# Patient Record
Sex: Female | Born: 1995 | Race: White | Hispanic: No | Marital: Married | Smoking: Never smoker
Health system: Southern US, Community
[De-identification: ages and names within clinical notes are randomized; demographics above are authoritative.]

## PROBLEM LIST (undated history)

## (undated) ENCOUNTER — Inpatient Hospital Stay: Payer: Self-pay

## (undated) DIAGNOSIS — R630 Anorexia: Secondary | ICD-10-CM

## (undated) DIAGNOSIS — G43909 Migraine, unspecified, not intractable, without status migrainosus: Secondary | ICD-10-CM

## (undated) DIAGNOSIS — F419 Anxiety disorder, unspecified: Secondary | ICD-10-CM

## (undated) DIAGNOSIS — D649 Anemia, unspecified: Secondary | ICD-10-CM

---

## 2016-03-26 DIAGNOSIS — F419 Anxiety disorder, unspecified: Secondary | ICD-10-CM | POA: Insufficient documentation

## 2016-06-30 DIAGNOSIS — Z789 Other specified health status: Secondary | ICD-10-CM | POA: Insufficient documentation

## 2017-12-02 DIAGNOSIS — F411 Generalized anxiety disorder: Secondary | ICD-10-CM | POA: Insufficient documentation

## 2021-06-03 ENCOUNTER — Other Ambulatory Visit: Payer: Self-pay | Admitting: Obstetrics and Gynecology

## 2021-06-03 DIAGNOSIS — Z3149 Encounter for other procreative investigation and testing: Secondary | ICD-10-CM

## 2021-06-05 ENCOUNTER — Encounter: Payer: Self-pay | Admitting: Obstetrics and Gynecology

## 2021-06-05 ENCOUNTER — Other Ambulatory Visit: Payer: Self-pay

## 2021-06-05 ENCOUNTER — Ambulatory Visit
Admission: RE | Admit: 2021-06-05 | Discharge: 2021-06-05 | Disposition: A | Discharge status: Home / Self Care | Payer: 59 | Source: Ambulatory Visit | Attending: Obstetrics and Gynecology | Admitting: Obstetrics and Gynecology

## 2021-06-05 DIAGNOSIS — Z3149 Encounter for other procreative investigation and testing: Secondary | ICD-10-CM | POA: Insufficient documentation

## 2021-06-05 DIAGNOSIS — N979 Female infertility, unspecified: Secondary | ICD-10-CM | POA: Diagnosis not present

## 2021-06-05 MED ORDER — IOHEXOL 300 MG/ML  SOLN
10.0000 mL | Freq: Once | INTRAMUSCULAR | Status: AC | PRN
  Administered 2021-06-05 (×2): 10 mL

## 2021-06-05 NOTE — Progress Notes (Unsigned)
[id number]  1996-02-19 24 y.o. 06/05/21  Tracy Douglas, MD  Hysterosalpingogram Procedure Note  Date of procedure: 06/05/2021   Pre-operative Diagnosis: Infertility  Post-operative Diagnosis: same  Procedure: Hysterosalpingogram  Surgeon: Cline Cools, MD  Assistant(s):  Radiology assistant. The radiologist present for today read the imaging and agreed with findings below.  Anesthesia: None  Estimated Blood Loss:  None         Complications:  Pt did have significant emotional reaction to procedure, based in the type of procedure, her duration of infertility and desire for conception, and a long history of dysmenorrhea. We terminated the procedure once, and she was willing to try again after discussing options of Chromotubation and referral to REI. She tolerated it exceptionally well the second time.         Disposition: To home         Condition: stable  Findings: Bilateral fill and spill of the tubes and a normal endometrial contour was noted.  Procedure Details  HSG procedure discussed with the patient.  Risks, complications, alternatives have been reviewed with her and she agrees to proceed.   The patient presented to the radiology lab and was identified as the correct patient and the procedure verified as an HSG. A verbal Time Out was held with all team members present and in agreement.  Speculum was inserted in to the vagina and the cervix visualized.  The cervix was cleaned with betadine solution. The HSG catheter was inserted and the balloon insufflated with approximately 1.5 ml of air.  Patient was then repositioned for fluoroscopy.  However, she did feel presyncopal at this point and we terminated the procedure. After a brief recovery period, she requested to proceed, and though a dx lap for inspection of endometriosis is reasonable given her hx of dysmenorrhea, she would really prefer to avoid general anesthesia.   Therefore, we proceeded to replace the  speculum, clean the cervix again, and without a tenaculum the HSG catheter was again inserted. A total of 15 ml of contrast was used for the procedure. The patient tolerated the procedure well, no complications.   Bilateral fill and spill of the tubes and a normal endometrial contour was noted.  Results were reviewed with the patient at the time of the procedure. She verbalized understanding.   Tracy Douglas, MD 06/05/2021

## 2021-08-04 DIAGNOSIS — Z3483 Encounter for supervision of other normal pregnancy, third trimester: Secondary | ICD-10-CM | POA: Insufficient documentation

## 2021-08-12 LAB — OB RESULTS CONSOLE HEPATITIS B SURFACE ANTIGEN: Hepatitis B Surface Ag: NEGATIVE

## 2021-08-12 LAB — OB RESULTS CONSOLE VARICELLA ZOSTER ANTIBODY, IGG: Varicella: NON-IMMUNE/NOT IMMUNE

## 2021-08-12 LAB — OB RESULTS CONSOLE RUBELLA ANTIBODY, IGM: Rubella: IMMUNE

## 2021-09-30 ENCOUNTER — Emergency Department
Admission: EM | Admit: 2021-09-30 | Discharge: 2021-09-30 | Disposition: A | Discharge status: Home / Self Care | Payer: 59 | Attending: Emergency Medicine | Admitting: Emergency Medicine

## 2021-09-30 ENCOUNTER — Other Ambulatory Visit: Payer: Self-pay

## 2021-09-30 DIAGNOSIS — Z3A17 17 weeks gestation of pregnancy: Secondary | ICD-10-CM | POA: Insufficient documentation

## 2021-09-30 DIAGNOSIS — O3462 Maternal care for abnormality of vagina, second trimester: Secondary | ICD-10-CM | POA: Diagnosis present

## 2021-09-30 DIAGNOSIS — R879 Unspecified abnormal finding in specimens from female genital organs: Secondary | ICD-10-CM

## 2021-09-30 DIAGNOSIS — Z8616 Personal history of COVID-19: Secondary | ICD-10-CM | POA: Insufficient documentation

## 2021-09-30 LAB — WET PREP, GENITAL
Clue Cells Wet Prep HPF POC: NONE SEEN
Sperm: NONE SEEN
Trich, Wet Prep: NONE SEEN
WBC, Wet Prep HPF POC: <10 (ref <10)
Yeast Wet Prep HPF POC: NONE SEEN

## 2021-09-30 LAB — RUPTURE OF MEMBRANE (ROM)PLUS: Rom Plus: NEGATIVE

## 2021-09-30 NOTE — ED Provider Notes (Signed)
[facility] Emergency Department Provider Note  ____________________________________________   Event Date/Time   First MD Initiated Contact with Patient 09/30/21 1442     (approximate)  I have reviewed the triage vital signs and the nursing notes.   HISTORY  Chief Complaint No chief complaint on file.   HPI Tracy Morse is a 25 y.o. female with a past medical history of anxiety and COVID in the first trimester of pregnancy who presents G1 P0 at 17 weeks 4 days for assessment of some leakage of clear fluid from her vagina earlier today.  Patient states that she went to a routine appointment and did not have any issues and and had no pelvic exam done but then she when she got home she had a rush of clear fluid.  She endorses some chronic intermittent lower pain in the abdomen but this is not any different or new today.  She denies any burning with urination, bleeding, diarrhea, constipation, headache, earache, sore throat, fevers, chills, cough or other acute associated symptoms.  No injuries or falls.  Denies EtOH use, drug use or tobacco abuse.  No other acute concerns at this time.         No past medical history on file.  There are no problems to display for this patient.     Prior to Admission medications   Not on File    Allergies Cephalosporins  No family history on file.  Social History    Review of Systems  Review of Systems  Constitutional:  Negative for chills and fever.  HENT:  Negative for sore throat.   Eyes:  Negative for pain.  Respiratory:  Negative for cough and stridor.   Cardiovascular:  Negative for chest pain.  Gastrointestinal:  Positive for abdominal pain (intermittent mild over past several weeks). Negative for vomiting.  Genitourinary:  Negative for dysuria.  Musculoskeletal:  Negative for myalgias.  Skin:  Negative for rash.  Neurological:  Negative for seizures, loss of consciousness and headaches.   Psychiatric/Behavioral:  Negative for suicidal ideas.   All other systems reviewed and are negative.    ____________________________________________   PHYSICAL EXAM:  VITAL SIGNS: ED Triage Vitals  Enc Vitals Group     BP 09/30/21 1338 140/76     Pulse Rate 09/30/21 1338 (!) 101     Resp 09/30/21 1338 18     Temp 09/30/21 1343 98.5 F (36.9 C)     Temp Source 09/30/21 1343 Oral     SpO2 09/30/21 1338 99 %     Weight 09/30/21 1342 165 lb (74.8 kg)     Height 09/30/21 1342 5\' 2"  (1.575 m)     Head Circumference --      Peak Flow --      Pain Score 09/30/21 1342 2     Pain Loc --      Pain Edu? --      Excl. in GC? --    Vitals:   09/30/21 1338 09/30/21 1343  BP: 140/76   Pulse: (!) 101   Resp: 18   Temp:  98.5 F (36.9 C)  SpO2: 99%    Physical Exam Vitals and nursing note reviewed.  Constitutional:      General: She is not in acute distress.    Appearance: She is well-developed.  HENT:     Head: Normocephalic and atraumatic.     Right Ear: External ear normal.     Left Ear: External ear normal.  Nose: Nose normal.  Eyes:     Conjunctiva/sclera: Conjunctivae normal.  Cardiovascular:     Rate and Rhythm: Normal rate and regular rhythm.     Heart sounds: No murmur heard. Pulmonary:     Effort: Pulmonary effort is normal. No respiratory distress.     Breath sounds: Normal breath sounds.  Abdominal:     Palpations: Abdomen is soft.     Tenderness: There is no abdominal tenderness.  Musculoskeletal:        General: No swelling.     Cervical back: Neck supple.  Skin:    General: Skin is warm and dry.     Capillary Refill: Capillary refill takes less than 2 seconds.  Neurological:     Mental Status: She is alert and oriented to person, place, and time.  Psychiatric:        Mood and Affect: Mood normal.    Gravid abdomen. ____________________________________________   LABS (all labs ordered are listed, but only abnormal results are  displayed)  Labs Reviewed  WET PREP, GENITAL  ROM PLUS ([facility] ONLY)   ____________________________________________  EKG   ____________________________________________  RADIOLOGY  ED MD interpretation:    Official radiology report(s): No results found.  ____________________________________________   PROCEDURES  Procedure(s) performed (including Critical Care):  Procedures   ____________________________________________   INITIAL IMPRESSION / ASSESSMENT AND PLAN / ED COURSE        Patient presents with above-stated history exam for assessment of vaginal fluid noted earlier today prior to arrival.  On arrival patient is afebrile hemodynamically stable.  She denies any acute associated pain other than endorsing some chronic soreness in the lower abdomen which is not different today.  Differential includes premature rupture of membranes, urinary incontinence versus possible infectious process.  CNM   Donato Schultz  Consulted came and saw the patient and confirmed on pelvic exam that there was no protruding sac or active fluid leakage, bleeding, or other acute abnormality.  Plan is to follow-up with prep and ROM Plus.    Wet prep and ROM plus were negative.  Discussed with Donato Schultz recommends no additional testing at this time and patient can follow-up with OB.  Nothing this is reasonable.  Unclear if this was an episode of some incontinence versus other discharge given absence of infection onexam or wet prep and negative ROM plus. I think she is stable for discharge with outpatient follow-up.  Discharged in stable condition.  Strict return precautions advised and discussed.      ____________________________________________   FINAL CLINICAL IMPRESSION(S) / ED DIAGNOSES  Final diagnoses:  Abnormal vaginal fluids    Medications - No data to display   ED Discharge Orders     None        Note:  This document was prepared using Dragon voice recognition  software and may include unintentional dictation errors.    Gilles Chiquito, MD 09/30/21 [phone]

## 2021-09-30 NOTE — ED Notes (Signed)
Consulted by Antoine Primas, MD in the ER.   Tracy Morse is a 25 y.o. female. She is at [redacted]w[redacted]d gestation. No LMP recorded. Estimated Date of Delivery: 03/05/2022.  Prenatal care site: [facility]   Current pregnancy complicated by:  1. COVID during pregnancy (1st trimester) 2. Pregnancy achieved with Clomid 3. H/o mental health diagnoses  Dx: Anorexia, Anxiety 4. Elevated BP at Amenorrhea visit 5. Remote hx Cold sores 6. Varicella Non-Immune  7. Tachycardia  Chief complaint: She had a prenatal visit this morning and reports everything was fine. She came home and was standing in her kitchen when she experienced a "gush" of clear vaginal discharge. She states it did not feel like she urinated on herself. She denies any further discharge. Reports occasional cramping.  S: Resting comfortably. no CTX, no VB.no LOF,  Active fetal movement.  Denies: HA, visual changes, SOB, or RUQ/epigastric pain  Maternal Medical History:  No past medical history on file.   Allergies  Allergen Reactions   Cephalosporins Rash    Prior to Admission medications   Not on File    Family History: family history is not on file.  no history of gyn cancers  Review of Systems: A full review of systems was performed and negative except as noted in the HPI.     O:  BP (!) 142/77    Pulse 100    Temp 98.5 F (36.9 C) (Oral)    Resp 18    Ht 5\' 2"  (1.575 m)    Wt 74.8 kg    SpO2 100%    BMI 30.18 kg/m  Results for orders placed or performed during the hospital encounter of 09/30/21 (from the past 48 hour(s))  Wet prep, genital   Collection Time: 09/30/21  4:03 PM  Result Value Ref Range   Yeast Wet Prep HPF POC NONE SEEN NONE SEEN   Trich, Wet Prep NONE SEEN NONE SEEN   Clue Cells Wet Prep HPF POC NONE SEEN NONE SEEN   WBC, Wet Prep HPF POC <10 <10   Sperm NONE SEEN   ROM Plus ([facility] only)   Collection Time: 09/30/21  4:03 PM  Result Value Ref Range   Rom Plus NEGATIVE        Constitutional: NAD, AAOx3  HE/ENT: extraocular movements grossly intact, moist mucous membranes CV: RRR PULM: nl respiratory effort, CTABL     Abd: gravid, non-tender, non-distended, soft      Ext: Non-tender, Nonedematous   Psych: mood appropriate, speech normal Pelvic: SSE done, cervix is closed and thick, no vaginal discharge noted. Negative pooling.  Pelvic exam: normal external genitalia, vulva, vagina, cervix, uterus and adnexa.  Bedside 10/02/21: Approximately 17-wk fetus with active fetal movement, adequate amniotic fluid, and adequate cardiac motion. Both parents visualized the [place] with amniotic fluid, fetal movement, and cardiac motion.  A/P: 25 y.o. @ [redacted]w[redacted]d here for antenatal surveillance for vaginal discharge  Principle Diagnosis:  Normal pregnancy in 2nd trimester  Consulted by Dr. [redacted]w[redacted]d in the ER for [redacted]w[redacted]d pregnancy with ? Leaking fluid. No signs of amniotic fluid, no discharge noted in vagina. Cervix is closed. Adequate amniotic fluid noted around fetus with good fetal movement noted on bedside [redacted]w[redacted]d.  No signs of PPROM. No signs of infection. Wet prep and ROM+ both negative. Discussed with Wisdom and her husband that increased vaginal discharge in pregnancy can sometimes feel like leaking amniotic fluid. If she is ever concerned that she may have PPROM, she should come  to the ER or L&D for evaluation. Reviewed s/s of PPROM vs normal vaginal discharge. No obstetrical concerns at this time. ER doctor may discharge her if he feels that is appropriate. Follow up in OB office as indicated.   Janyce Llanos, CNM 09/30/2021 4:52 PM

## 2021-09-30 NOTE — ED Triage Notes (Signed)
Pt here with leaking fluid and wants to be evaluated. Pt states that it was a gush of fluid and was advised to come to the ED. Pt in NAD in triage.

## 2021-09-30 NOTE — ED Notes (Signed)
KC to send on call OB provider to evaluate pt.

## 2021-09-30 NOTE — ED Triage Notes (Signed)
Pt states that she is 17.[redacted] weeks pregnant and is having fluid leaking called her OBGYN and they told her to come here to be evaluated.

## 2021-10-09 DIAGNOSIS — R002 Palpitations: Secondary | ICD-10-CM | POA: Insufficient documentation

## 2021-10-19 NOTE — L&D Delivery Note (Addendum)
Delivery Note ? ?First Stage: ?Labor onset: 02/27/2022 @ 0700 ?Augmentation : cytotec, Foley balloon, pitocin, AROM ?Analgesia /Anesthesia intrapartum: epidural ?AROM at 02/27/2022 @ 0709 ? ?Second Stage: ?Complete dilation at 1429 ?Onset of pushing at 1615 ?FHR second stage Category II, variable decelerations with good recovery, intermittent late decelerations, recurrent early decelerations ? ?Delivery of a viable female infant 02/27/2022 at 1909 by Donato Schultz, CNM ?delivery of fetal head in OA position with restitution to LOA. Anterior then posterior shoulders delivered easily with gentle downward traction and fetal rotation from LOA to ROA. Continued rotating baby to OP and LOP to deliver fetal hips. ?No nuchal cord;  Baby placed on mom's chest, and attended to by peds.  ?Cord double clamped at 1 minute of life, cut by FOB. ? ?Third Stage: ?Placenta delivered Tomasa Blase intact with 3 VC @ 1913 ?Placenta disposition: discarded ?Uterine tone firm / bleeding scant ? ?1st degree vaginal laceration identified  ?Anesthesia for repair: n/a ?Repair: approximated, no bleeding ?Est. Blood Loss (mL): ? ?Complications: none ? ?Mom to postpartum.  Baby to Couplet care / Skin to Skin. ? ?Newborn: ?Birth Weight: TBD, infant skin-to-skin  ?Apgar Scores: 6, 8 ?Feeding planned: breast ? ? ? ?

## 2021-11-27 LAB — OB RESULTS CONSOLE GC/CHLAMYDIA
Chlamydia: NEGATIVE
Gonorrhea: NEGATIVE

## 2021-12-12 DIAGNOSIS — O99013 Anemia complicating pregnancy, third trimester: Secondary | ICD-10-CM | POA: Insufficient documentation

## 2021-12-25 ENCOUNTER — Encounter: Payer: Self-pay | Admitting: Obstetrics and Gynecology

## 2021-12-25 ENCOUNTER — Observation Stay: Payer: 59

## 2021-12-25 ENCOUNTER — Other Ambulatory Visit: Payer: Self-pay

## 2021-12-25 ENCOUNTER — Observation Stay
Admission: EM | Admit: 2021-12-25 | Discharge: 2021-12-25 | Disposition: A | Discharge status: Home / Self Care | Payer: 59 | Attending: Obstetrics and Gynecology | Admitting: Obstetrics and Gynecology

## 2021-12-25 DIAGNOSIS — O26842 Uterine size-date discrepancy, second trimester: Secondary | ICD-10-CM | POA: Insufficient documentation

## 2021-12-25 DIAGNOSIS — Z3A31 31 weeks gestation of pregnancy: Secondary | ICD-10-CM | POA: Insufficient documentation

## 2021-12-25 DIAGNOSIS — Y92410 Unspecified street and highway as the place of occurrence of the external cause: Secondary | ICD-10-CM | POA: Insufficient documentation

## 2021-12-25 DIAGNOSIS — N9489 Other specified conditions associated with female genital organs and menstrual cycle: Secondary | ICD-10-CM

## 2021-12-25 DIAGNOSIS — O9A213 Injury, poisoning and certain other consequences of external causes complicating pregnancy, third trimester: Principal | ICD-10-CM | POA: Insufficient documentation

## 2021-12-25 DIAGNOSIS — O368130 Decreased fetal movements, third trimester, not applicable or unspecified: Secondary | ICD-10-CM | POA: Insufficient documentation

## 2021-12-25 DIAGNOSIS — Z8616 Personal history of COVID-19: Secondary | ICD-10-CM | POA: Insufficient documentation

## 2021-12-25 DIAGNOSIS — Y9389 Activity, other specified: Secondary | ICD-10-CM | POA: Insufficient documentation

## 2021-12-25 HISTORY — DX: Migraine, unspecified, not intractable, without status migrainosus: G43.909

## 2021-12-25 HISTORY — DX: Anxiety disorder, unspecified: F41.9

## 2021-12-25 HISTORY — DX: Anorexia: R63.0

## 2021-12-25 LAB — CBC
HCT: 30.6 % — ABNORMAL LOW (ref 36.0–46.0)
Hemoglobin: 9.8 g/dL — ABNORMAL LOW (ref 12.0–15.0)
MCH: 27.1 pg (ref 26.0–34.0)
MCHC: 32 g/dL (ref 30.0–36.0)
MCV: 84.8 fL (ref 80.0–100.0)
Platelets: 211 10*3/uL (ref 150–400)
RBC: 3.61 MIL/uL — ABNORMAL LOW (ref 3.87–5.11)
RDW: 14.7 % (ref 11.5–15.5)
WBC: 10.1 10*3/uL (ref 4.0–10.5)
nRBC: 0 % (ref 0.0–0.2)

## 2021-12-25 LAB — KLEIHAUER-BETKE STAIN
Fetal Cells %: 0 %
Quantitation Fetal Hemoglobin: 0 mL

## 2021-12-25 LAB — ABO/RH: ABO/RH(D): A POS

## 2021-12-25 MED ORDER — PRENATAL MULTIVITAMIN CH: 1.0000 | ORAL_TABLET | Freq: Every day | ORAL | Status: DC

## 2021-12-25 MED ORDER — ACETAMINOPHEN 500 MG PO TABS: 1000.0000 mg | ORAL_TABLET | Freq: Four times a day (QID) | ORAL | Status: DC | PRN

## 2021-12-25 NOTE — Progress Notes (Signed)
Pt discharged home per [facility], CNM order.  Pt stable and ambulatory and an After Visit Summary was printed and given to the patient. Discharge education completed with patient/family including follow up instructions, appointments, and medication list. Pt received labor and bleeding precautions. Patient able to verbalize understanding, all questions fully answered upon discharge. Patient instructed to return to ED, call 911, or call MD for any changes in condition. Pt discharged home via personal vehicle with support person.  ? ?
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
## 2022-01-12 ENCOUNTER — Other Ambulatory Visit: Payer: Self-pay | Admitting: Obstetrics

## 2022-01-12 DIAGNOSIS — O99013 Anemia complicating pregnancy, third trimester: Secondary | ICD-10-CM

## 2022-01-14 ENCOUNTER — Ambulatory Visit
Admission: RE | Admit: 2022-01-14 | Discharge: 2022-01-14 | Disposition: A | Discharge status: Home / Self Care | Payer: 59 | Source: Ambulatory Visit | Attending: Obstetrics | Admitting: Obstetrics

## 2022-01-14 DIAGNOSIS — D649 Anemia, unspecified: Secondary | ICD-10-CM | POA: Diagnosis not present

## 2022-01-14 DIAGNOSIS — O99013 Anemia complicating pregnancy, third trimester: Secondary | ICD-10-CM | POA: Diagnosis present

## 2022-01-14 DIAGNOSIS — Z3A00 Weeks of gestation of pregnancy not specified: Secondary | ICD-10-CM | POA: Diagnosis not present

## 2022-01-14 MED ORDER — SODIUM CHLORIDE 0.9 % IV SOLN
300.0000 mg | INTRAVENOUS | Status: DC
  Administered 2022-01-14: 300 mg via INTRAVENOUS
  Filled 2022-01-14: qty 300

## 2022-01-23 ENCOUNTER — Ambulatory Visit
Admission: RE | Admit: 2022-01-23 | Discharge: 2022-01-23 | Disposition: A | Discharge status: Home / Self Care | Payer: 59 | Source: Ambulatory Visit | Attending: Obstetrics | Admitting: Obstetrics

## 2022-01-23 DIAGNOSIS — O99013 Anemia complicating pregnancy, third trimester: Secondary | ICD-10-CM | POA: Diagnosis present

## 2022-01-23 MED ORDER — SODIUM CHLORIDE 0.9 % IV SOLN
300.0000 mg | Freq: Once | INTRAVENOUS | Status: AC
  Administered 2022-01-23: 300 mg via INTRAVENOUS
  Filled 2022-01-23: qty 300

## 2022-01-30 ENCOUNTER — Ambulatory Visit
Admission: RE | Admit: 2022-01-30 | Discharge: 2022-01-30 | Disposition: A | Discharge status: Home / Self Care | Payer: 59 | Source: Ambulatory Visit | Attending: Obstetrics | Admitting: Obstetrics

## 2022-01-30 DIAGNOSIS — D649 Anemia, unspecified: Secondary | ICD-10-CM | POA: Diagnosis not present

## 2022-01-30 DIAGNOSIS — Z3A00 Weeks of gestation of pregnancy not specified: Secondary | ICD-10-CM | POA: Insufficient documentation

## 2022-01-30 DIAGNOSIS — O99013 Anemia complicating pregnancy, third trimester: Secondary | ICD-10-CM | POA: Diagnosis not present

## 2022-01-30 MED ORDER — SODIUM CHLORIDE 0.9 % IV SOLN
300.0000 mg | Freq: Once | INTRAVENOUS | Status: AC
  Administered 2022-01-30: 300 mg via INTRAVENOUS
  Filled 2022-01-30: qty 15

## 2022-02-02 ENCOUNTER — Ambulatory Visit: Payer: 59

## 2022-02-05 LAB — OB RESULTS CONSOLE HIV ANTIBODY (ROUTINE TESTING): HIV: NONREACTIVE

## 2022-02-06 ENCOUNTER — Ambulatory Visit
Admission: RE | Admit: 2022-02-06 | Discharge: 2022-02-06 | Disposition: A | Discharge status: Home / Self Care | Payer: 59 | Source: Ambulatory Visit | Attending: Obstetrics | Admitting: Obstetrics

## 2022-02-06 DIAGNOSIS — Z3A00 Weeks of gestation of pregnancy not specified: Secondary | ICD-10-CM | POA: Diagnosis not present

## 2022-02-06 DIAGNOSIS — O99013 Anemia complicating pregnancy, third trimester: Secondary | ICD-10-CM | POA: Diagnosis not present

## 2022-02-06 MED ORDER — SODIUM CHLORIDE FLUSH 0.9 % IV SOLN
INTRAVENOUS | Status: AC
  Administered 2022-02-06: 10 mL
  Filled 2022-02-06: qty 10

## 2022-02-06 MED ORDER — SODIUM CHLORIDE 0.9 % IV SOLN
300.0000 mg | Freq: Once | INTRAVENOUS | Status: AC
  Administered 2022-02-06: 300 mg via INTRAVENOUS
  Filled 2022-02-06: qty 300

## 2022-02-12 DIAGNOSIS — B951 Streptococcus, group B, as the cause of diseases classified elsewhere: Secondary | ICD-10-CM | POA: Insufficient documentation

## 2022-02-12 DIAGNOSIS — Z8616 Personal history of COVID-19: Secondary | ICD-10-CM | POA: Insufficient documentation

## 2022-02-17 ENCOUNTER — Other Ambulatory Visit: Payer: Self-pay | Admitting: Obstetrics and Gynecology

## 2022-02-17 NOTE — Progress Notes (Signed)
26 y.o. G1P0000 at 107w5d by Patient's last menstrual period was 05/29/2021 (exact date). consistent with ultrasound @[redacted]w[redacted]d  .Estimated Date of Delivery: 03/05/2022. ?Sex of baby and name: boy " "                         FOB:    Chase ?  ?Factors complicating this pregnancy  ?  ?1. COVID during pregnancy ?Which trimester? 1st trimester ?3rd trimester growth u/s scheduled: 01/21/22 ?Weekly NST at 36 weeks ?  ?2. Pregnancy achieved with Clomid ?Taking progesterone until 12-16 weeks ?  ?3. H/o mental health diagnoses ?Dx: Anorexia, Anxiety ?Medications prior to pregnancy: none ?Medical team following her: ?Medications during pregnancy: ?Counseling:  ?4. Elevated BP at Amenorrhea visit ?06/09/21 122/84 ?07/21/21 143/81 - Amenorrhea visit ?07/23/21 112/74 ?08/12/21 146/83 recheck 116/76 - NOB visit ?  ?5. Would like to refuse STI testing in pregnancy  ?Discussed at 14wks, pt with neg HIV at her annual physical.  ?She agrees to HIV and GC/Ct for screening at 36wks ?6. Remote hx Cold sores ?No genital sores ever, last oral HSV 4-40yrs ago ?Would like to be on suppressive tx prior to delivery ?Valtrex 500mg  BID at 36wks  ?7. Varicella Non-Immune  ?Advise vaccine post partum ?  ?8. Tachycardia ?HR 140-150 when walking ?Referral to cardiology placed 09/30/21 ?10/30/21 no significant findings ?Better since iron transfusions  ?  ?9. Anemia in pregnancy  ?Hgb 10.1 at 28 weeks  ?Started on iron supplements   ?01/07/22: Hgb: 10.8 Ferritin: 8 ?Weekly iron infusions - received 4 transfusions  ?  ?      10. + GBS  2/23 - treat in labor  ?New allergy dicloxacillin - rash/itching ?Has tried amoxicillin in past with no reaction  ?  ?Screening results and needs: ?NOB:  ?Medicaid Questionnaire: N/A ?[]  [place] [facility] program  ?Depression Score: 1 ?MBT: A Pos  Ab screen: Neg  Pap: 03/05/20 NILM HIV: not done Hep B/RPR: Neg/NR G/C: not done ?Rubella: Immune   VZV: Non-Immune ?Aneuploidy:  ?First trimester:  ?MaternitT21: declined 08/12/21 ?Second  trimester (AFP/tetra): declined 11/22 ?28 weeks:  ?Review Medicaid Questionnaire:n/a ?Depression Score:10 ?Blood consent:signed 12/11/21 ?Hgb: 10.1  Platelets: 241    Glucola: 95  Rhogam: N/A ?36 weeks:  ?GBS: positive   G/C: neg/neg  Hgb: 11.1  Platelets: 215   HIV/RPR: neg/NR   ?Last 08/14/21:  ?07/21/21: Uterus retroverted, Single, viable IUP, S=[redacted]w[redacted]d, Yolk sac and amnion seen, FHR= 162bpm, Cervical length=3.74cm, Rt corpus luteum cyst=2.3cm, Left ovary appears wnl, Free fluid seen near both ovaries ?10/16/21:Normal anatomy seen Single, viable IUP, S=[redacted]w[redacted]d FHR=147bpm Cervical length=5.72cm B/l ovaries appear wnl Placenta=anterior Position=breech ?   12/11/21: breech Fhr=134 bpm Ant   plac Afi=17.83 cm= 68% Efw=1248 g= 62% ?                01/21/22: transverse, head rt, ant plac, [redacted]w[redacted]d g@75 %, AFI- 16.67 cm @61 % ?      02/12/22: EFW=7lb2oz(3232g)=80% AFI=21.91cm@85 % FHR=139bpm Placenta=anterior Position =vertex,spine rt ?Immunization:   ?Flu in season -  ?Tdap at 27-36 weeks - given bl 12/11/21 ?Contraception Plan: NFP, non-hormonal - needed clomid for this pregnancy  ?Feeding Plan: breast  ?Labor plans - would like IOL 39-40 weeks  ?

## 2022-02-23 ENCOUNTER — Other Ambulatory Visit: Payer: Self-pay | Admitting: Obstetrics and Gynecology

## 2022-02-26 ENCOUNTER — Encounter: Payer: Self-pay | Admitting: Obstetrics and Gynecology

## 2022-02-26 ENCOUNTER — Inpatient Hospital Stay
Admission: RE | Admit: 2022-02-26 | Discharge: 2022-03-01 | DRG: 807 | Disposition: A | Discharge status: Home / Self Care | Payer: 59 | Attending: Certified Nurse Midwife | Admitting: Certified Nurse Midwife

## 2022-02-26 ENCOUNTER — Other Ambulatory Visit: Payer: Self-pay

## 2022-02-26 DIAGNOSIS — O99344 Other mental disorders complicating childbirth: Secondary | ICD-10-CM | POA: Diagnosis present

## 2022-02-26 DIAGNOSIS — O26849 Uterine size-date discrepancy, unspecified trimester: Secondary | ICD-10-CM | POA: Insufficient documentation

## 2022-02-26 DIAGNOSIS — O9902 Anemia complicating childbirth: Secondary | ICD-10-CM | POA: Diagnosis present

## 2022-02-26 DIAGNOSIS — O26893 Other specified pregnancy related conditions, third trimester: Secondary | ICD-10-CM | POA: Diagnosis present

## 2022-02-26 DIAGNOSIS — F419 Anxiety disorder, unspecified: Secondary | ICD-10-CM | POA: Diagnosis present

## 2022-02-26 DIAGNOSIS — Z3A39 39 weeks gestation of pregnancy: Secondary | ICD-10-CM | POA: Diagnosis not present

## 2022-02-26 DIAGNOSIS — Z3490 Encounter for supervision of normal pregnancy, unspecified, unspecified trimester: Secondary | ICD-10-CM | POA: Diagnosis present

## 2022-02-26 DIAGNOSIS — O99824 Streptococcus B carrier state complicating childbirth: Secondary | ICD-10-CM | POA: Diagnosis present

## 2022-02-26 DIAGNOSIS — Z8616 Personal history of COVID-19: Secondary | ICD-10-CM

## 2022-02-26 HISTORY — DX: Anemia, unspecified: D64.9

## 2022-02-26 LAB — CBC
HCT: 37.6 % (ref 36.0–46.0)
Hemoglobin: 12.5 g/dL (ref 12.0–15.0)
MCH: 28 pg (ref 26.0–34.0)
MCHC: 33.2 g/dL (ref 30.0–36.0)
MCV: 84.3 fL (ref 80.0–100.0)
Platelets: 190 10*3/uL (ref 150–400)
RBC: 4.46 MIL/uL (ref 3.87–5.11)
RDW: 17.8 % — ABNORMAL HIGH (ref 11.5–15.5)
WBC: 10.6 10*3/uL — ABNORMAL HIGH (ref 4.0–10.5)
nRBC: 0 % (ref 0.0–0.2)

## 2022-02-26 LAB — TYPE AND SCREEN
ABO/RH(D): A POS
Antibody Screen: NEGATIVE

## 2022-02-26 MED ORDER — SOD CITRATE-CITRIC ACID 500-334 MG/5ML PO SOLN: 30.0000 mL | ORAL | Status: DC | PRN

## 2022-02-26 MED ORDER — SODIUM CHLORIDE 0.9 % IV SOLN
1.0000 g | INTRAVENOUS | Status: DC
  Administered 2022-02-27 (×2): 1 g via INTRAVENOUS
  Filled 2022-02-26 (×2): qty 1000

## 2022-02-26 MED ORDER — LIDOCAINE HCL (PF) 1 % IJ SOLN
INTRAMUSCULAR | Status: AC
  Filled 2022-02-26: qty 30

## 2022-02-26 MED ORDER — AMMONIA AROMATIC IN INHA
RESPIRATORY_TRACT | Status: AC
  Filled 2022-02-26: qty 10

## 2022-02-26 MED ORDER — MISOPROSTOL 50MCG HALF TABLET
50.0000 ug | ORAL_TABLET | ORAL | Status: DC | PRN
  Administered 2022-02-26: 50 ug via BUCCAL
  Filled 2022-02-26: qty 1

## 2022-02-26 MED ORDER — ONDANSETRON HCL 4 MG/2ML IJ SOLN
4.0000 mg | Freq: Four times a day (QID) | INTRAMUSCULAR | Status: DC | PRN
Start: 2022-02-26 — End: 2022-02-27
  Administered 2022-02-27: 4 mg via INTRAVENOUS
  Filled 2022-02-26: qty 2

## 2022-02-26 MED ORDER — TERBUTALINE SULFATE 1 MG/ML IJ SOLN: 0.2500 mg | Freq: Once | INTRAMUSCULAR | Status: DC | PRN

## 2022-02-26 MED ORDER — MISOPROSTOL 25 MCG QUARTER TABLET
25.0000 ug | ORAL_TABLET | ORAL | Status: DC | PRN
  Filled 2022-02-26 (×2): qty 1

## 2022-02-26 MED ORDER — OXYTOCIN 10 UNIT/ML IJ SOLN
INTRAMUSCULAR | Status: AC
  Filled 2022-02-26: qty 2

## 2022-02-26 MED ORDER — LIDOCAINE HCL (PF) 1 % IJ SOLN
30.0000 mL | INTRAMUSCULAR | Status: DC | PRN
Start: 2022-02-26 — End: 2022-02-27

## 2022-02-26 MED ORDER — MISOPROSTOL 25 MCG QUARTER TABLET
25.0000 ug | ORAL_TABLET | ORAL | Status: DC | PRN
  Administered 2022-02-26: 25 ug via ORAL

## 2022-02-26 MED ORDER — OXYTOCIN-SODIUM CHLORIDE 30-0.9 UT/500ML-% IV SOLN
2.5000 [IU]/h | INTRAVENOUS | Status: DC
  Filled 2022-02-26: qty 1000

## 2022-02-26 MED ORDER — LACTATED RINGERS IV SOLN: 500.0000 mL | INTRAVENOUS | Status: DC | PRN

## 2022-02-26 MED ORDER — HYDROXYZINE HCL 50 MG PO TABS
50.0000 mg | ORAL_TABLET | Freq: Every evening | ORAL | Status: DC | PRN
  Administered 2022-02-26: 50 mg via ORAL
  Filled 2022-02-26 (×2): qty 1

## 2022-02-26 MED ORDER — MISOPROSTOL 200 MCG PO TABS
ORAL_TABLET | ORAL | Status: AC
  Administered 2022-02-26: 25 ug via VAGINAL
  Filled 2022-02-26: qty 4

## 2022-02-26 MED ORDER — LACTATED RINGERS IV SOLN: INTRAVENOUS | Status: DC

## 2022-02-26 MED ORDER — FENTANYL CITRATE (PF) 100 MCG/2ML IJ SOLN
INTRAMUSCULAR | Status: AC
  Filled 2022-02-26: qty 2

## 2022-02-26 MED ORDER — FENTANYL CITRATE (PF) 100 MCG/2ML IJ SOLN
50.0000 ug | Freq: Once | INTRAMUSCULAR | Status: AC
  Administered 2022-02-26: 50 ug via INTRAVENOUS

## 2022-02-26 MED ORDER — MISOPROSTOL 25 MCG QUARTER TABLET
ORAL_TABLET | ORAL | Status: AC
  Administered 2022-02-26: 25 ug via ORAL
  Filled 2022-02-26: qty 1

## 2022-02-26 MED ORDER — OXYTOCIN BOLUS FROM INFUSION
333.0000 mL | Freq: Once | INTRAVENOUS | Status: AC
  Administered 2022-02-27: 333 mL via INTRAVENOUS

## 2022-02-26 MED ORDER — OXYTOCIN 10 UNIT/ML IJ SOLN
10.0000 [IU] | Freq: Once | INTRAMUSCULAR | Status: DC
Start: 2022-02-26 — End: 2022-02-27

## 2022-02-26 MED ORDER — SODIUM CHLORIDE 0.9 % IV SOLN
2.0000 g | Freq: Once | INTRAVENOUS | Status: AC
Start: 2022-02-26 — End: 2022-02-27
  Administered 2022-02-27: 2 g via INTRAVENOUS
  Filled 2022-02-26: qty 2000

## 2022-02-26 NOTE — H&P (Signed)
OB History & Physical  ? ?History of Present Illness:  ?Chief Complaint: scheduled induction ? ?HPI:  ?Tracy Morse is a 26 y.o. G1P0 female at [redacted]w[redacted]d dated by LMP and c/w [place] at [redacted]w[redacted]d.  She presents to L&D for scheduled IOL.  ? ?Reports active FM, denies UCs, LOF or VB.  ?  ? ?Pregnancy Issues: ?1. COVID during pregnancy ?2. Pregnancy achieved with Clomid ?3. H/o mental health diagnoses; Anorexia, Anxiety ?4. Elevated BP at Amenorrhea visit ?5. Varicella NON-immune ?6. Anemia in preg ?7. Tachycardia, better after iron infusions ?8. GBS Pos, no reaction to Amoxicillin ? ? ?Maternal Medical History:  ? ?Past Medical History:  ?Diagnosis Date  ? Anemia   ? Anorexia   ? Anxiety   ? Migraine   ? ? ?History reviewed. No pertinent surgical history. ? ?Allergies  ?Allergen Reactions  ? Cephalosporins Rash  ? Dicloxacillin Sodium Itching  ? ? ?Prior to Admission medications   ?Medication Sig Start Date End Date Taking? Authorizing Provider  ?Prenatal Vit-Fe Fumarate-FA (MULTIVITAMIN-PRENATAL) 27-0.8 MG TABS tablet Take 1 tablet by mouth daily at 12 noon.   Yes [provider]  ?valACYclovir (VALTREX) 500 MG tablet Take 500 mg by mouth daily.   Yes [provider]  ?clindamycin (CLEOCIN) 300 MG capsule Take 300 mg by mouth 4 (four) times daily. ?Patient not taking: Reported on 02/06/2022    [provider]  ?ferrous sulfate 325 (65 FE) MG EC tablet Take 325 mg by mouth 3 (three) times daily with meals. ?Patient not taking: Reported on 02/06/2022    [provider]  ? ? ? ?Prenatal care site: [facility] ? ?Social History: She  reports that she has never smoked. She has never used smokeless tobacco. She reports that she does not drink alcohol and does not use drugs. ? ?Family History: family history includes Alcohol abuse in her father; Anxiety disorder in her mother; Colon cancer in her maternal grandmother and paternal grandmother; Colon polyps in her father and mother;  Coronary artery disease in her maternal grandfather and paternal grandfather; High blood pressure in her father; Hyperlipidemia in her father and mother.  ? ?Review of Systems: A full review of systems was performed and negative except as noted in the HPI.   ? ? ?Physical Exam:  ?Vital Signs: BP 130/81 (BP Location: Left Arm)   Pulse (!) 112   Temp 98.6 ?F (37 ?C) (Oral)   Resp 18   Ht 5\' 2"  (1.575 m)   Wt 88 kg   BMI 35.48 kg/m?  ?General: no acute distress.  ?HEENT: normocephalic, atraumatic ?Heart: regular rate & rhythm.  No murmurs/rubs/gallops ?Lungs: clear to auscultation bilaterally, normal respiratory effort ?Abdomen: soft, gravid, non-tender;  EFW: 7.5lbs ?Pelvic:  ? External: Normal external female genitalia ? Cervix: Dilation: 1 /   /    ?  ?Extremities: non-tender, symmetric, no edema bilaterally.  DTRs: 2+  ?Neurologic: Alert & oriented x 3.   ? ?No results Morse for this or any previous visit (from the past 24 hour(s)). ? ?Pertinent Results:  ?Prenatal Labs: ?Blood type/Rh A pos  ?Antibody screen neg  ?Rubella Immune  ?Varicella NON- Immune  ?RPR NR  ?HBsAg Neg  ?HIV NR  ?GC neg  ?Chlamydia neg  ?Genetic screening  declined  ?1 hour GTT  95  ?3 hour GTT   ?GBS  POS  ? ?FHT: 135bpm, mod variability, + accels, no decels ?TOCO: q6-52min ?SVE:  Dilation: 1 /   /    ?  ?  Cephalic by leopolds/SVE ? ?No results Morse. ? ?Assessment:  ?Tracy Morse is a 26 y.o. G1P0 female at [redacted]w[redacted]d with elective IOL.  ? ?Plan:  ?1. Admit to Labor & Delivery; consents reviewed and obtained ? ?2. Fetal Well being  ?- Fetal Tracing: cat I ?- Group B Streptococcus ppx indicated: Pos ?- Presentation: cephalic confirmed by exam and Leopolds  ? ?3. Routine OB: ?- Prenatal labs reviewed, as above ?- Rh A pos ?- CBC, T&S, RPR on admit ?- Clear fluids, IVF ? ?4. Induction of Labor ?-  Contractions: external toco in place ?-  Pelvis adequate for TOL ?-  Plan for induction with cytotec for ripening ?-  Plan for continuous  fetal monitoring  ?-  Maternal pain control as desired ?- Anticipate vaginal delivery ? ?5. Post Partum Planning: ?- Infant feeding: breast ?- Contraception: TBD ?-Tdap 12/11/21/flu due in season ? ?Tracy Morse, CNM ?02/26/22 ?8:29 AM ? ? ? ? ?

## 2022-02-26 NOTE — Progress Notes (Addendum)
Labor Progress Note ? ?Tracy Morse is a 26 y.o. G1P0 at [redacted]w[redacted]d by LMP admitted for induction of labor due to Elective at term. ? ?Subjective: fot feeling any pain, feeling frustrated.  ? ?Objective: ?BP 124/78 (BP Location: Left Arm)   Pulse 92   Temp 99 ?F (37.2 ?C) (Oral)   Resp 19   Ht 5\' 2"  (1.575 m)   Wt 88 kg   BMI 35.48 kg/m?  ?Notable VS details: reviewed ? ?Vitals:  ? 02/26/22 0805 02/26/22 1146 02/26/22 1600 02/26/22 1930  ?BP: 130/81 132/71 132/70 124/78  ? ? ? ?Fetal Assessment: ?FHT:  FHR: 125 bpm, variability: moderate,  accelerations:  Present,  decelerations:  Absent ?Category/reactivity:  Category I ?UC:   irregular, every 3-6 minutes ?SVE:   ?Dilation: 3cm  ?Effacement: 50%  ?Station:  -2  ?Consistency: soft  ?Position: posterior  ?Membrane status:intact ?Amniotic color: n/a ? ?Labs: ?Lab Results  ?Component Value Date  ? WBC 10.6 (H) 02/26/2022  ? HGB 12.5 02/26/2022  ? HCT 37.6 02/26/2022  ? MCV 84.3 02/26/2022  ? PLT 190 02/26/2022  ? ? ?Assessment / Plan: ?G1 at 39.0wks, elective term induction ? ?Labor: s/p Cytotec oral and vaginal x 1, s/p foley balloon and oral cytotec, cytotec repeat again now- will give buccal due to pt discomfort with exams.  Plan to start Pitocin at 0600 ?Preeclampsia:  no signs or symptoms of toxicity ?Fetal Wellbeing:  Category I ?Pain Control:  Labor support without medications ?I/D:   GBS Pos- will start Abx with active labor or SROM ?Anticipated MOD:  NSVD ? ?04/28/2022, CNM ?02/26/2022, 10:27 PM ? ? ? ? ? ? ? ? ?

## 2022-02-26 NOTE — Progress Notes (Signed)
Labor Progress Note ? ?Tracy Morse is a 26 y.o. G1P0 at [redacted]w[redacted]d by LMP admitted for induction of labor due to Elective at term. ? ?Subjective: feeling some UCs, mild and crampy ? ?Objective: ?BP 130/81 (BP Location: Left Arm)   Pulse (!) 112   Temp 98.6 ?F (37 ?C) (Oral)   Resp 18   Ht 5\' 2"  (1.575 m)   Wt 88 kg   BMI 35.48 kg/m?  ?Notable VS details: reviewed ? ?Vitals:  ? 02/26/22 0805  ?BP: 130/81  ? ? ? ?Fetal Assessment: ?FHT:  FHR: 135 bpm, variability: moderate,  accelerations:  Present,  decelerations:  Absent ?Category/reactivity:  Category I ?UC:   irregular, every 1-3 minutes ?SVE:    ?Dilation: 1cm  ?Effacement: 50%  ?Station:  -1  ?Consistency: soft  ?Position: posterior  ?Membrane status:intact ?Amniotic color: n/a ? ?Labs: ?Lab Results  ?Component Value Date  ? WBC 10.6 (H) 02/26/2022  ? HGB 12.5 02/26/2022  ? HCT 37.6 02/26/2022  ? MCV 84.3 02/26/2022  ? PLT 190 02/26/2022  ? ? ?Assessment / Plan: ?G1 at 39.0wks, elective term induction ? ?Labor: s/p Cytotec oral and vaginal approx 4hrs ago, pt feeling cramping and contracting well. Cervix 1/long. Will reassess 2hours, if UCs spaced out, will repeat cytotec for ripening; consider [facility] cath.  ?Preeclampsia:  no signs or symptoms of toxicity ?Fetal Wellbeing:  Category I ?Pain Control:  Labor support without medications ?I/D:   GBS Pos- will start Abx with active labor or SROM ?Anticipated MOD:  NSVD ? ?[facility], CNM ?02/26/2022, 2:37 PM ? ? ? ? ? ? ? ? ?

## 2022-02-26 NOTE — Progress Notes (Signed)
Labor Progress Note ? ?Tracy Morse is a 26 y.o. G1P0 at [redacted]w[redacted]d by LMP admitted for induction of labor due to Elective at term. ? ?Subjective: feeling more crampy with UCs. IVPM given for premedication before catheter placement.  ? ?Objective: ?BP 132/70 (BP Location: Left Arm)   Pulse 88   Temp 98.3 ?F (36.8 ?C) (Oral)   Resp 18   Ht 5\' 2"  (1.575 m)   Wt 88 kg   BMI 35.48 kg/m?  ?Notable VS details: reviewed ? ?Vitals:  ? 02/26/22 0805 02/26/22 1146 02/26/22 1600  ?BP: 130/81 132/71 132/70  ? ? ? ?Fetal Assessment: ?FHT:  FHR: 135 bpm, variability: moderate,  accelerations:  Present,  decelerations:  Absent ?Category/reactivity:  Category I ?UC:   irregular, every 1-5 minutes ?SVE:   Foley balloon placed, inflated with 40ml normal saline and taped to gentle traction.  ?Dilation: 1cm  ?Effacement: 50%  ?Station:  -1  ?Consistency: soft  ?Position: posterior  ?Membrane status:intact ?Amniotic color: n/a ? ?Labs: ?Lab Results  ?Component Value Date  ? WBC 10.6 (H) 02/26/2022  ? HGB 12.5 02/26/2022  ? HCT 37.6 02/26/2022  ? MCV 84.3 02/26/2022  ? PLT 190 02/26/2022  ? ? ?Assessment / Plan: ?G1 at 39.0wks, elective term induction ? ?Labor: s/p Cytotec oral and vaginal x 1, now has foley balloon placed and cytotec given orally.  ?Preeclampsia:  no signs or symptoms of toxicity ?Fetal Wellbeing:  Category I ?Pain Control:  Labor support without medications ?I/D:   GBS Pos- will start Abx with active labor or SROM ?Anticipated MOD:  NSVD ? ?04/28/2022, CNM ?02/26/2022, 5:22 PM ? ? ? ? ? ? ? ? ?

## 2022-02-27 ENCOUNTER — Encounter: Payer: Self-pay | Admitting: Obstetrics and Gynecology

## 2022-02-27 ENCOUNTER — Inpatient Hospital Stay: Payer: 59 | Admitting: Anesthesiology

## 2022-02-27 LAB — RPR: RPR Ser Ql: NONREACTIVE

## 2022-02-27 MED ORDER — BENZOCAINE-MENTHOL 20-0.5 % EX AERO
1.0000 "application " | INHALATION_SPRAY | CUTANEOUS | Status: DC | PRN
  Administered 2022-02-27: 1 via TOPICAL
  Filled 2022-02-27 (×2): qty 56

## 2022-02-27 MED ORDER — FENTANYL-BUPIVACAINE-NACL 0.5-0.125-0.9 MG/250ML-% EP SOLN
EPIDURAL | Status: AC
  Filled 2022-02-27: qty 250

## 2022-02-27 MED ORDER — FENTANYL CITRATE (PF) 100 MCG/2ML IJ SOLN
INTRAMUSCULAR | Status: DC | PRN
  Administered 2022-02-27: 100 ug via EPIDURAL

## 2022-02-27 MED ORDER — ACETAMINOPHEN 500 MG PO TABS
1000.0000 mg | ORAL_TABLET | Freq: Four times a day (QID) | ORAL | Status: DC | PRN
  Administered 2022-02-27: 1000 mg via ORAL

## 2022-02-27 MED ORDER — LACTATED RINGERS IV SOLN
500.0000 mL | Freq: Once | INTRAVENOUS | Status: AC
  Administered 2022-02-27: 500 mL via INTRAVENOUS

## 2022-02-27 MED ORDER — FENTANYL-BUPIVACAINE-NACL 0.5-0.125-0.9 MG/250ML-% EP SOLN
12.0000 mL/h | EPIDURAL | Status: DC | PRN
  Administered 2022-02-27: 12 mL/h via EPIDURAL

## 2022-02-27 MED ORDER — WITCH HAZEL-GLYCERIN EX PADS
1.0000 "application " | MEDICATED_PAD | CUTANEOUS | Status: DC | PRN
  Administered 2022-02-27: 1 via TOPICAL
  Filled 2022-02-27 (×2): qty 100

## 2022-02-27 MED ORDER — VARICELLA VIRUS VACCINE LIVE 1350 PFU/0.5ML IJ SUSR
0.5000 mL | Freq: Once | INTRAMUSCULAR | Status: DC
  Filled 2022-02-27: qty 0.5

## 2022-02-27 MED ORDER — PRENATAL MULTIVITAMIN CH
1.0000 | ORAL_TABLET | Freq: Every day | ORAL | Status: DC
  Administered 2022-02-28: 1 via ORAL
  Filled 2022-02-27: qty 1

## 2022-02-27 MED ORDER — BUTORPHANOL TARTRATE 1 MG/ML IJ SOLN
INTRAMUSCULAR | Status: AC
Start: 2022-02-27 — End: 2022-02-27
  Filled 2022-02-27: qty 1

## 2022-02-27 MED ORDER — ONDANSETRON HCL 4 MG PO TABS: 4.0000 mg | ORAL_TABLET | ORAL | Status: DC | PRN

## 2022-02-27 MED ORDER — DIPHENHYDRAMINE HCL 50 MG/ML IJ SOLN: 12.5000 mg | INTRAMUSCULAR | Status: DC | PRN

## 2022-02-27 MED ORDER — ACETAMINOPHEN 500 MG PO TABS
ORAL_TABLET | ORAL | Status: AC
  Administered 2022-02-27: 650 mg via ORAL
  Filled 2022-02-27: qty 2

## 2022-02-27 MED ORDER — ONDANSETRON HCL 4 MG/2ML IJ SOLN: 4.0000 mg | INTRAMUSCULAR | Status: DC | PRN

## 2022-02-27 MED ORDER — LIDOCAINE HCL (PF) 1 % IJ SOLN
INTRAMUSCULAR | Status: DC | PRN
  Administered 2022-02-27 (×3): 1 mL

## 2022-02-27 MED ORDER — ZOLPIDEM TARTRATE 5 MG PO TABS: 5.0000 mg | ORAL_TABLET | Freq: Every evening | ORAL | Status: DC | PRN

## 2022-02-27 MED ORDER — OXYCODONE HCL 5 MG PO TABS: 5.0000 mg | ORAL_TABLET | ORAL | Status: DC | PRN

## 2022-02-27 MED ORDER — PHENYLEPHRINE 80 MCG/ML (10ML) SYRINGE FOR IV PUSH (FOR BLOOD PRESSURE SUPPORT): 80.0000 ug | PREFILLED_SYRINGE | INTRAVENOUS | Status: DC | PRN

## 2022-02-27 MED ORDER — PRENATAL MULTIVITAMIN CH: 1.0000 | ORAL_TABLET | Freq: Every day | ORAL | Status: DC

## 2022-02-27 MED ORDER — FENTANYL CITRATE (PF) 100 MCG/2ML IJ SOLN
INTRAMUSCULAR | Status: AC
  Filled 2022-02-27: qty 2

## 2022-02-27 MED ORDER — DIBUCAINE (PERIANAL) 1 % EX OINT: 1.0000 "application " | TOPICAL_OINTMENT | CUTANEOUS | Status: DC | PRN

## 2022-02-27 MED ORDER — SENNOSIDES-DOCUSATE SODIUM 8.6-50 MG PO TABS
2.0000 | ORAL_TABLET | Freq: Every day | ORAL | Status: DC
  Administered 2022-02-28 – 2022-03-01 (×2): 2 via ORAL
  Filled 2022-02-27 (×2): qty 2

## 2022-02-27 MED ORDER — SODIUM CHLORIDE 0.9 % IV SOLN
INTRAVENOUS | Status: DC | PRN
  Administered 2022-02-27 (×2): 5 mL via EPIDURAL

## 2022-02-27 MED ORDER — EPHEDRINE 5 MG/ML INJ: 10.0000 mg | INTRAVENOUS | Status: DC | PRN

## 2022-02-27 MED ORDER — COCONUT OIL OIL
1.0000 "application " | TOPICAL_OIL | Status: DC | PRN
  Filled 2022-02-27: qty 120

## 2022-02-27 MED ORDER — DIPHENHYDRAMINE HCL 25 MG PO CAPS: 25.0000 mg | ORAL_CAPSULE | Freq: Four times a day (QID) | ORAL | Status: DC | PRN

## 2022-02-27 MED ORDER — OXYTOCIN-SODIUM CHLORIDE 30-0.9 UT/500ML-% IV SOLN
1.0000 m[IU]/min | INTRAVENOUS | Status: DC
  Administered 2022-02-27: 2 m[IU]/min via INTRAVENOUS

## 2022-02-27 MED ORDER — LIDOCAINE-EPINEPHRINE (PF) 1.5 %-1:200000 IJ SOLN: INTRAMUSCULAR | Status: DC | PRN

## 2022-02-27 MED ORDER — LIDOCAINE-EPINEPHRINE (PF) 1.5 %-1:200000 IJ SOLN
INTRAMUSCULAR | Status: DC | PRN
Start: 2022-02-27 — End: 2022-02-27
  Administered 2022-02-27: 3 mL via EPIDURAL

## 2022-02-27 MED ORDER — SIMETHICONE 80 MG PO CHEW: 80.0000 mg | CHEWABLE_TABLET | ORAL | Status: DC | PRN

## 2022-02-27 MED ORDER — IBUPROFEN 600 MG PO TABS
600.0000 mg | ORAL_TABLET | Freq: Four times a day (QID) | ORAL | Status: DC
  Administered 2022-02-27 – 2022-03-01 (×7): 600 mg via ORAL
  Filled 2022-02-27 (×7): qty 1

## 2022-02-27 MED ORDER — ACETAMINOPHEN 325 MG PO TABS
650.0000 mg | ORAL_TABLET | ORAL | Status: DC | PRN
Start: 2022-02-27 — End: 2022-03-01
  Administered 2022-02-28 – 2022-03-01 (×4): 650 mg via ORAL
  Filled 2022-02-27 (×5): qty 2

## 2022-02-27 NOTE — Progress Notes (Signed)
Labor Progress Note ? ?Tracy Morse is a 26 y.o. G1P0 at [redacted]w[redacted]d by LMP admitted for induction of labor due to Elective at term. ? ?Subjective: She is feeling much more comfortable with her epidural. Can feel the pressure of the contractions but denies any pain. ? ?Objective: ?BP (!) 145/69   Pulse 76   Temp 98.1 ?F (36.7 ?C) (Oral)   Resp 18   Ht 5\' 2"  (1.575 m)   Wt 88 kg   SpO2 98%   BMI 35.48 kg/m?  ?Notable VS details: reviewed ? ?Fetal Assessment: ?FHT:  FHR: 135 bpm, variability: moderate,  accelerations:  Present,  decelerations:  Absent ?Category/reactivity:  Category I ?UC:   regular, every 2-3 minutes ?SVE:   RN briefly checked patient immediately prior to epidural placement and states pt is 5cm ?Membrane status:AROM @ ?Amniotic color: clear ? ?Labs: ?Lab Results  ?Component Value Date  ? WBC 10.6 (H) 02/26/2022  ? HGB 12.5 02/26/2022  ? HCT 37.6 02/26/2022  ? MCV 84.3 02/26/2022  ? PLT 190 02/26/2022  ? ? ?Assessment / Plan: ?Induction of labor due to elective at term,  progressing well on pitocin ? ?Labor:  Progressing well on pitocin  after AROM ?Preeclampsia:  no signs or symptoms of toxicity ?Fetal Wellbeing:  Category I ?Pain Control:   just received an epidural ?I/D:   GBS positive, received first dose of PCN at 0700 ?Anticipated MOD:  NSVD ? ?04/28/2022, CNM ?02/27/2022, 10:08 AM ? ? ? ? ? ? ? ? ?

## 2022-02-27 NOTE — Progress Notes (Signed)
Labor Progress Note ? ?Tracy Morse is a 26 y.o. G1P0 at [redacted]w[redacted]d by LMP admitted for induction of labor due to Elective at term. ? ?Subjective: she is feeling better after an epidural bolus ? ?Objective: ?BP (!) 123/42   Pulse 77   Temp 98.6 ?F (37 ?C) (Oral)   Resp 18   Ht 5\' 2"  (1.575 m)   Wt 88 kg   SpO2 99%   BMI 35.48 kg/m?  ?Notable VS details: reviewed ? ?Fetal Assessment: ?FHT:  FHR: 155 bpm, variability: minimal ,  accelerations:  Present,  decelerations:  Present intermittent variable decels, recurrent early decels ?Category/reactivity:  Category II ?UC:   regular, every 2-3 minutes ?SVE:    ?Dilation: 10cm  ?Effacement: 100%  ?Station:  +3  ?Consistency: soft  ?Position: middle  ?Membrane status:AROM @ [id number] ?Amniotic color: clear ? ?Labs: ?Lab Results  ?Component Value Date  ? WBC 10.6 (H) 02/26/2022  ? HGB 12.5 02/26/2022  ? HCT 37.6 02/26/2022  ? MCV 84.3 02/26/2022  ? PLT 190 02/26/2022  ? ? ?Assessment / Plan: ?Induction of labor due to elective at term,  progressing well on pitocin ? ?Labor:  pushing well  for 30 minutes, good fetal descent with pushing, will continue pushing  ?Preeclampsia:  no signs or symptoms of toxicity ?Fetal Wellbeing:  Category II ?Pain Control:  Epidural ?I/D:   GBS positive, has been treated 3x ?Anticipated MOD:  NSVD ? ?Gertie Fey, CNM ?02/27/2022, 4:49 PM ? ? ? ? ? ? ? ? ?

## 2022-02-27 NOTE — Anesthesia Preprocedure Evaluation (Signed)
Anesthesia Evaluation  ?Patient identified by MRN, date of birth, ID band ?Patient awake ? ? ? ?Reviewed: ?Allergy & Precautions, NPO status , Patient's Chart, lab work & pertinent test results ? ?History of Anesthesia Complications ?Negative for: history of anesthetic complications ? ?Airway ?Mallampati: III ? ?TM Distance: >3 FB ?Neck ROM: full ? ? ? Dental ? ?(+) Chipped ?  ?Pulmonary ?neg pulmonary ROS, neg shortness of breath,  ?  ?Pulmonary exam normal ? ? ? ? ? ? ? Cardiovascular ?Exercise Tolerance: Good ?(-) hypertensionnegative cardio ROS ?Normal cardiovascular exam ? ? ?  ?Neuro/Psych ? Headaches,   ? GI/Hepatic ?negative GI ROS,   ?Endo/Other  ? ? Renal/GU ?  ?negative genitourinary ?  ?Musculoskeletal ? ? Abdominal ?  ?Peds ? Hematology ?negative hematology ROS ?(+)   ?Anesthesia Other Findings ?Past Medical History: ?No date: Anemia ?No date: Anorexia ?No date: Anxiety ?No date: Migraine ? ?History reviewed. No pertinent surgical history. ? ?BMI   ? Body Mass Index: 35.48 kg/m?  ?  ? ? Reproductive/Obstetrics ?(+) Pregnancy ? ?  ? ? ? ? ? ? ? ? ? ? ? ? ? ?  ?  ? ? ? ? ? ? ? ? ?Anesthesia Physical ?Anesthesia Plan ? ?ASA: 2 ? ?Anesthesia Plan: Epidural  ? ?Post-op Pain Management:   ? ?Induction:  ? ?PONV Risk Score and Plan:  ? ?Airway Management Planned: Natural Airway ? ?Additional Equipment:  ? ?Intra-op Plan:  ? ?Post-operative Plan:  ? ?Informed Consent: I have reviewed the patients History and Physical, chart, labs and discussed the procedure including the risks, benefits and alternatives for the proposed anesthesia with the patient or authorized representative who has indicated his/her understanding and acceptance.  ? ? ? ?Dental Advisory Given ? ?Plan Discussed with: Anesthesiologist ? ?Anesthesia Plan Comments: (Patient reports no bleeding problems and no anticoagulant use. ? ? ?Patient consented for risks of anesthesia including but not limited to:  ?-  adverse reactions to medications ?- risk of bleeding, infection and or nerve damage from epidural that could lead to paralysis ?- risk of headache or failed epidural ?- nerve damage due to positioning ?- that if epidural is used for C-section that there is a chance of epidural failure requiring spinal placement or conversion to GA ?- Damage to heart, brain, lungs, other parts of body or loss of life ? ?Patient voiced understanding.)  ? ? ? ? ? ? ?Anesthesia Quick Evaluation ? ?

## 2022-02-27 NOTE — Progress Notes (Signed)
Labor Progress Note ? ?Tracy Morse is a 26 y.o. G1P0 at [redacted]w[redacted]d by LMP admitted for induction of labor due to Elective at term. ? ?Subjective: She feels occasional rectal pressure ? ?Objective: ?BP (!) 123/42   Pulse 77   Temp 99.2 ?F (37.3 ?C) (Oral)   Resp 18   Ht 5\' 2"  (1.575 m)   Wt 88 kg   SpO2 99%   BMI 35.48 kg/m?  ?Notable VS details: reviewed ? ?Fetal Assessment: ?FHT:  FHR: 135 bpm, variability: moderate,  accelerations:  Present,  decelerations:  Present recurrent early decels ?Category/reactivity:  Category I ?UC:   regular, every 2-3 minutes ?SVE:    ?Dilation: 10cm  ?Effacement: 100%  ?Station:  +2  ?Consistency: medium  ?Position: anterior  ?Membrane status:AROM @ ?Amniotic color: clear ? ?Labs: ?Lab Results  ?Component Value Date  ? WBC 10.6 (H) 02/26/2022  ? HGB 12.5 02/26/2022  ? HCT 37.6 02/26/2022  ? MCV 84.3 02/26/2022  ? PLT 190 02/26/2022  ? ? ?Assessment / Plan: ?Induction of labor due to elective at term,  progressing well on pitocin ? ?Labor:  she is 10/100/+2, only feeling occasional rectal pressure, does not want to push at this time, will labor down for 1hr  or until she feels pressure or signs of fetal distress ?Preeclampsia:  no signs or symptoms of toxicity ?Fetal Wellbeing:  Category I ?Pain Control:  Epidural ?I/D:   GBS positive, received 3 doses PCN ?Anticipated MOD:  NSVD ? ?04/28/2022, CNM ?02/27/2022, 4:02 PM ? ? ? ? ? ? ? ? ?

## 2022-02-27 NOTE — Discharge Summary (Signed)
Obstetrical Discharge Summary  Patient Name: Tracy Morse DOB: [birth year] MRN: [id number]  Date of Admission: 02/26/2022 Date of Delivery: 02/27/2022 Delivered by: Donato Schultz, CNM Date of Discharge: 03/01/2022  Primary OB: [facility] LMP:No LMP recorded. EDC Estimated Date of Delivery: 03/05/22 Gestational Age at Delivery: [redacted]w[redacted]d   Antepartum complications:  1. COVID during pregnancy 2. Pregnancy achieved with Clomid 3. H/o mental health diagnoses; Anorexia, Anxiety 4. Elevated BP at Amenorrhea visit 5. Varicella NON-immune 6. Anemia in preg 7. Tachycardia, better after iron infusions 8. GBS Pos, no reaction to Amoxicillin Admitting Diagnosis: Scheduled IOL Secondary Diagnosis: Patient Active Problem List   Diagnosis Date Noted   NSVD (normal spontaneous vaginal delivery) 03/01/2022   Uterine size date discrepancy pregnancy 02/26/2022   History of 2019 novel coronavirus disease (COVID-19) 02/12/2022   Positive GBS test 02/12/2022   Uterine size date discrepancy, second trimester 12/25/2021   Anemia during pregnancy in third trimester 12/12/2021   Palpitations 10/09/2021   Encounter for supervision of other normal pregnancy, third trimester 08/04/2021   Generalized anxiety disorder 12/02/2017   Immune to varicella 06/30/2016   Anxiety 03/26/2016    Augmentation: AROM, Pitocin, Cytotec, and OP Foley Complications: None Intrapartum complications/course: She arrived for elective induction. Received several doses of cytotec, a Foley balloon, pitocin, and AROM. She progressed to 10/100/+2 and pushed for 3 hours. She delivered viable female infant over intact perineum.  Date of Delivery: 02/27/2022 Delivered By: Donato Schultz, CNM Delivery Type: spontaneous vaginal delivery Anesthesia: epidural Placenta: spontaneous Laceration: 1st degree vaginal Episiotomy: none Newborn Data: Live born female "Lance" Birth Weight:  3760g (8lb4.6oz) APGAR: 6,  8  Newborn Delivery   Birth date/time: 02/27/2022 19:09:00 Delivery type: Vaginal, Spontaneous      Postpartum Procedures: none Edinburgh:     02/28/2022   10:35 AM  Inocente Salles Postnatal Depression Scale Screening Tool  I have been able to laugh and see the funny side of things. 0  I have looked forward with enjoyment to things. 0  I have blamed myself unnecessarily when things went wrong. 1  I have been anxious or worried for no good reason. 3  I have felt scared or panicky for no good reason. 3  Things have been getting on top of me. 3  I have been so unhappy that I have had difficulty sleeping. 0  I have felt sad or miserable. 1  I have been so unhappy that I have been crying. 1  The thought of harming myself has occurred to me. 0  Edinburgh Postnatal Depression Scale Total 12      Post partum course:  Patient had an uncomplicated postpartum course.  By time of discharge on PPD#2, her pain was controlled on oral pain medications; she had appropriate lochia and was ambulating, voiding without difficulty and tolerating regular diet.  She was deemed stable for discharge to home.    Discharge Physical Exam:  BP 130/76 (BP Location: Right Arm)   Pulse 72   Temp 98.5 F (36.9 C) (Oral)   Resp 18   Ht 5\' 2"  (1.575 m)   Wt 88 kg   SpO2 100%   Breastfeeding Unknown   BMI 35.48 kg/m   General: NAD CV: RRR Pulm: CTABL, nl effort ABD: s/nd/nt, fundus firm and below the umbilicus Lochia: moderate Perineum: intact DVT Evaluation: LE non-ttp, no evidence of DVT on exam.  Hemoglobin  Date Value Ref Range Status  02/28/2022 12.0 12.0 - 15.0 g/dL Final   HCT  Date Value Ref Range Status  02/28/2022 36.1 36.0 - 46.0 % Final     Disposition: stable, discharge to home. Baby Feeding: breastmilk Baby Disposition: home with mom  Rh Immune globulin given: n/a, A pos Rubella vaccine given: immune Varicella vaccine given: Non-immune Tdap vaccine given in AP or PP setting:  received 11/2021  Flu vaccine given in AP or PP setting: received fall 2022  Contraception: condoms  Prenatal Labs:  Blood type/Rh A pos  Antibody screen neg  Rubella Immune  Varicella NON- Immune  RPR NR  HBsAg Neg  HIV NR  GC neg  Chlamydia neg  Genetic screening  declined  1 hour GTT  95  3 hour GTT    GBS  POS     Plan:  Ronna Herskowitz was discharged to home in good condition. Follow-up appointment with delivering provider in 6 weeks.  Discharge Medications: Allergies as of 03/01/2022       Reactions   Cephalosporins Rash   Dicloxacillin Sodium Itching        Medication List     STOP taking these medications    clindamycin 300 MG capsule Commonly known as: CLEOCIN   valACYclovir 500 MG tablet Commonly known as: VALTREX       TAKE these medications    ferrous sulfate 325 (65 FE) MG EC tablet Take 325 mg by mouth 3 (three) times daily with meals.   multivitamin-prenatal 27-0.8 MG Tabs tablet Take 1 tablet by mouth daily at 12 noon.   sertraline 50 MG tablet Commonly known as: ZOLOFT Take 1 tablet (50 mg total) by mouth daily.          Signed:  Cyril Mourning, CNM 03/01/2022 7:18 AM

## 2022-02-27 NOTE — Anesthesia Procedure Notes (Signed)
Epidural ?Patient location during procedure: OB ?Start time: 02/27/2022 7:57 AM ?End time: 02/27/2022 8:34 AM ? ?Staffing ?Anesthesiologist: Azim Gillingham, Cleda Mccreedy, MD ?Resident/CRNA: Ginger Carne, CRNA ?Other anesthesia staff: Elmarie Mainland, CRNA ?Performed: anesthesiologist and resident/CRNA  ? ?Preanesthetic Checklist ?Completed: patient identified, IV checked, site marked, risks and benefits discussed, surgical consent, monitors and equipment checked, pre-op evaluation and timeout performed ? ?Epidural ?Patient position: sitting ?Prep: ChloraPrep ?Patient monitoring: heart rate, continuous pulse ox and blood pressure ?Approach: midline ?Location: L3-L4 ?Injection technique: LOR saline ? ?Needle:  ?Needle type: Tuohy  ?Needle gauge: 17 G ?Needle length: 9 cm and 9 ?Needle insertion depth: 6 cm ?Catheter type: closed end flexible ?Catheter size: 19 Gauge ?Catheter at skin depth: 10 cm ?Test dose: negative and 1.5% lidocaine with Epi 1:200 K ? ?Assessment ?Sensory level: T10 ?Events: blood not aspirated, injection not painful, no injection resistance, no paresthesia and negative IV test ? ?Additional Notes ?multiple attempts, first attempts by CRNAs, final by MD.   ?Pt. Evaluated and documentation done after procedure finished. ?Patient identified. Risks/Benefits/Options discussed with patient including but not limited to bleeding, infection, nerve damage, paralysis, failed block, incomplete pain control, headache, blood pressure changes, nausea, vomiting, reactions to medication both or allergic, itching and postpartum back pain. Confirmed with bedside nurse the patient's most recent platelet count. Confirmed with patient that they are not currently taking any anticoagulation, have any bleeding history or any family history of bleeding disorders. Patient expressed understanding and wished to proceed. All questions were answered. Sterile technique was used throughout the entire procedure. Please see nursing  notes for vital signs. Test dose was given through epidural catheter and negative prior to continuing to dose epidural or start infusion. Warning signs of high block given to the patient including shortness of breath, tingling/numbness in hands, complete motor block, or any concerning symptoms with instructions to call for help. Patient was given instructions on fall risk and not to get out of bed. All questions and concerns addressed with instructions to call with any issues or inadequate analgesia.   ? ?Patient tolerated the insertion well without immediate complications.Reason for block:procedure for pain ? ? ? ?

## 2022-02-28 LAB — CBC
HCT: 36.1 % (ref 36.0–46.0)
Hemoglobin: 12 g/dL (ref 12.0–15.0)
MCH: 28.2 pg (ref 26.0–34.0)
MCHC: 33.2 g/dL (ref 30.0–36.0)
MCV: 84.9 fL (ref 80.0–100.0)
Platelets: 164 10*3/uL (ref 150–400)
RBC: 4.25 MIL/uL (ref 3.87–5.11)
RDW: 17.8 % — ABNORMAL HIGH (ref 11.5–15.5)
WBC: 15 10*3/uL — ABNORMAL HIGH (ref 4.0–10.5)
nRBC: 0 % (ref 0.0–0.2)

## 2022-02-28 MED ORDER — SERTRALINE HCL 25 MG PO TABS
50.0000 mg | ORAL_TABLET | Freq: Every day | ORAL | Status: DC
  Administered 2022-02-28: 25 mg via ORAL
  Filled 2022-02-28: qty 2

## 2022-02-28 MED ORDER — SERTRALINE HCL 25 MG PO TABS: 25.0000 mg | ORAL_TABLET | Freq: Every day | ORAL | Status: DC

## 2022-02-28 NOTE — Progress Notes (Signed)
Post Partum Day 1 ? ?Subjective: ?Reports good output but sensation to void is not there yet. ? ?Pt requests medication for PPD prophylaxis d/t history of anxiety  ? ?Doing well, no concerns. Ambulating without difficulty, pain managed with PO meds, tolerating regular diet, and voiding without difficulty.  ? ?No fever/chills, chest pain, shortness of breath, nausea/vomiting, or leg pain. No nipple or breast pain.  ? ?Objective: ?BP 120/71 (BP Location: Left Arm)   Pulse 91   Temp 97.7 ?F (36.5 ?C) (Axillary)   Resp 18   Ht 5\' 2"  (1.575 m)   Wt 88 kg   SpO2 100%   Breastfeeding Unknown   BMI 35.48 kg/m?  ?  ?Physical Exam:  ?General: alert and cooperative ?Breasts: soft/nontender ?CV: RRR ?Pulm: nl effort ?Abdomen: soft, non-tender ?Uterine Fundus: firm ?Incision: n/a ?Perineum: intact ?Lochia: appropriate ?DVT Evaluation: No evidence of DVT seen on physical exam. ?Edinburgh:  ?   ? View : No data to display.  ?  ?  ?  ?  ? ?Recent Labs  ?  02/26/22 ?0824 02/28/22 ?0606  ?HGB 12.5 12.0  ?HCT 37.6 36.1  ?WBC 10.6* 15.0*  ?PLT 190 164  ? ? ?Assessment/Plan: ?26 y.o. G1P1001 postpartum day # 1 ? ?-Continue routine postpartum care ?-Immunization status: all immunizations up to date ? ?Disposition: Continue inpatient postpartum care  ? ? LOS: 2 days  ? ?Linda Hedges, CNM ?02/28/2022, 9:30 AM  ?  ?

## 2022-02-28 NOTE — Anesthesia Postprocedure Evaluation (Signed)
Anesthesia Post Note ? ?Patient: Tracy Morse ? ?Procedure(s) Performed: AN AD HOC LABOR EPIDURAL ? ?Patient location during evaluation: [facility] ?Anesthesia Type: Epidural ?Level of consciousness: awake and alert ?Pain management: pain level controlled ?Vital Signs Assessment: post-procedure vital signs reviewed and stable ?Respiratory status: spontaneous breathing, nonlabored ventilation and respiratory function stable ?Cardiovascular status: blood pressure returned to baseline and stable ?Postop Assessment: no apparent nausea or vomiting, patient able to bend at knees, able to ambulate, adequate PO intake, no headache and no backache ?Anesthetic complications: no ? ? ?No notable events documented. ? ? ?Last Vitals:  ?Vitals:  ? 02/28/22 0844 02/28/22 1157  ?BP: 120/71 118/64  ?Pulse: 91 97  ?Resp: 18   ?Temp: 36.5 ?C 36.4 ?C  ?SpO2: 100% 100%  ?  ?Last Pain:  ?Vitals:  ? 02/28/22 1228  ?TempSrc:   ?PainSc: 4   ? ? ?  ?  ?  ?  ?  ?  ? ?Foye Deer ? ? ? ? ?

## 2022-02-28 NOTE — Lactation Note (Signed)
This note was copied from a baby's chart. ?Lactation Consultation Note ? ?Patient Name: Tracy Morse ?Today's Date: 02/28/2022 ?Reason for consult: Follow-up assessment;Primapara;Term ?Age:27 hours ? ?Maternal Data ?Has patient been taught Hand Expression?: Yes ?Does the patient have breastfeeding experience prior to this delivery?: No ? ?Feeding ?Mother's Current Feeding Choice: Breast Milk ? ?Feeding attempt; baby was sleepy, not interested. ? ?LATCH Score ? ? ?Lactation Tools Discussed/Used ?Tools: Nipple Dorris Carnes ?Nipple shield size: 20 ? ?With stimulation nipples do evert slightly, but stay relatively flat. We discussed use, reason, and aid it can provide to the baby. ?LC sees bruising on either side of the nipple on both breasts- Infant appears to have a tight upper lip with blanching when pulled up, possible tongue tie-unable to evaluate at this time. ? ?Interventions ?Interventions: Breast feeding basics reviewed;Hand express;Support pillows;Position options;Education (nipple shield, spoon, curved tip syringe) ? ?Tools given for future use. Discussed hand expression into spoon, curved tip syringe to slowly provide nutrition to baby while sleeping if he remains uninterested. ? ?Discharge ?Pump: Personal ? ?Consult Status ?Consult Status: Follow-up ? ? ? ?Danford Bad ?02/28/2022, 1:03 PM ? ? ? ?

## 2022-02-28 NOTE — TOC Initial Note (Signed)
Transition of Care (TOC) - Initial/Assessment Note  ? ? ?Patient Details  ?Name: Tracy Morse ?MRN: [id number] ?Date of Birth: December 05, 1995 ? ?Transition of Care (TOC) CM/SW Contact:    ?Virgin Zellers L Kamilah Correia, LCSWA ?Phone Number: ?02/28/2022, 11:34 AM ? ?Clinical Narrative:                 ? ?TOC consult for high edinburgh score. CSW spoke to patient and patient reported history of anxiety. Patient reported talking to her midwife and having a plan to start back on medication upon discharge. Patient reported having PCP and pediatrician for baby. Patient reported having all necessary baby items and strong support at home.  ? ?No TOC needs at this time.  ?  ?  ? ? ?Patient Goals and CMS Choice ?  ?  ?  ? ?Expected Discharge Plan and Services ?  ?  ?  ?  ?  ?                ?  ?  ?  ?  ?  ?  ?  ?  ?  ?  ? ?Prior Living Arrangements/Services ?  ?  ?  ?       ?  ?  ?  ?  ? ?Activities of Daily Living ?Home Assistive Devices/Equipment: Eyeglasses ?ADL Screening (condition at time of admission) ?Patient's cognitive ability adequate to safely complete daily activities?: Yes ?Is the patient deaf or have difficulty hearing?: No ?Does the patient have difficulty seeing, even when wearing glasses/contacts?: No ?Does the patient have difficulty concentrating, remembering, or making decisions?: No ?Patient able to express need for assistance with ADLs?: Yes ?Does the patient have difficulty dressing or bathing?: No ?Independently performs ADLs?: Yes (appropriate for developmental age) ?Does the patient have difficulty walking or climbing stairs?: No ?Weakness of Legs: None ?Weakness of Arms/Hands: None ? ?Permission Sought/Granted ?  ?  ?   ?   ?   ?   ? ?Emotional Assessment ?  ?  ?  ?  ?  ?  ? ?Admission diagnosis:  Encounter for elective induction of labor [Z34.90] ?Patient Active Problem List  ? Diagnosis Date Noted  ? Uterine size date discrepancy pregnancy 02/26/2022  ? Encounter for elective induction of labor 02/26/2022   ? History of 2019 novel coronavirus disease (COVID-19) 02/12/2022  ? Positive GBS test 02/12/2022  ? Uterine size date discrepancy, second trimester 12/25/2021  ? Anemia during pregnancy in third trimester 12/12/2021  ? Palpitations 10/09/2021  ? Encounter for supervision of other normal pregnancy, third trimester 08/04/2021  ? Generalized anxiety disorder 12/02/2017  ? Immune to varicella 06/30/2016  ? Anxiety 03/26/2016  ? ?PCP:  Clear, Walker Kehr, FNP ?Pharmacy:   ?[id number] - [place], [place] - [address] ?[address] ?[place] [address] ?Phone: [phone] Fax: [phone] ? ? ? ? ?Social Determinants of Health (SDOH) Interventions ?  ? ?Readmission Risk Interventions ?   ? View : No data to display.  ?  ?  ?  ? ? ? ?

## 2022-02-28 NOTE — Lactation Note (Signed)
This note was copied from a baby's chart. ?Lactation Consultation Note ? ?Patient Name: Tracy Morse ?Today's Date: 02/28/2022 ?Reason for consult: Initial assessment;Primapara;Term ?Age:27 hours ? ?Maternal Data ?Has patient been taught Hand Expression?: Yes ?Does the patient have breastfeeding experience prior to this delivery?: No ? ?Feeding ?Mother's Current Feeding Choice: Breast Milk ? ?A few documented feedings since birth. Several voids and 1 stool diaper. Having difficulty with latching on left side. ?Mom desires exclusive breastfeeding. ? ?LATCH Score ? ?Lactation Tools Discussed/Used ?  ? ?Interventions ?Interventions: Breast feeding basics reviewed;Hand express;Position options;Education ? ?Reviewed normal newborn behaviors and feeding patterns, early hunger cues, signs of fullness, and impact circumcision may have on desire to eat today. ?Encouraged frequent attempts (8-12x in first 24 hours), reassurance given that baby not eat every time. Reviewed benefits of skin to skin, and output expectations. ? ?Discharge ?Pump: Personal ? ?Consult Status ?Consult Status: Follow-up ? ?Whiteboard updated. Mom plans to call LC at next feeding to assist with feeding on Left side. ? ?Danford Bad ?02/28/2022, 9:55 AM ? ? ? ?

## 2022-02-28 NOTE — Lactation Note (Signed)
This note was copied from a baby's chart. ?Lactation Consultation Note ? ?Patient Name: Tracy Morse ?Today's Date: 02/28/2022 ?Reason for consult: Follow-up assessment;Term ?Age:27 hours ? ?Maternal Data ?Has patient been taught Hand Expression?: Yes ?Does the patient have breastfeeding experience prior to this delivery?: No ? ?Feeding ?Mother's Current Feeding Choice: Breast Milk ? ?Baby was alert/awake upon entry, licking and sucking hand. Parents had just given 48mL expressed colostrum- this seemed to  get baby ready for additional feeding. ?Reviewed positions and placement of nipple shield. Mom independently placed shield and put baby in football hold. ? ?LATCH Score ?Latch: Grasps breast easily, tongue down, lips flanged, rhythmical sucking. (Grasped easily with the nipple shield; initial tongue thrusting but then stayed) ? ?Audible Swallowing: A few with stimulation (LC heard 2 distinct swallows within first few minutes at breast) ? ?Type of Nipple: Flat ? ?Comfort (Breast/Nipple): Filling, red/small blisters or bruises, mild/mod discomfort (slight bruising) ? ?Hold (Positioning): Assistance needed to correctly position infant at breast and maintain latch. ? ?LATCH Score: 6 ? ?LC adjusted positioning of baby for chin in first, showed latch; assisted with pulling down on chin for deeper latch, and continued to provide assistance of keeping baby pushed in deeply. Swallows were noted. ? ?Lactation Tools Discussed/Used ?Tools: Nipple Jefferson Fuel ?Nipple shield size: 20 ?Breast pump type: Double-Electric Breast Pump ?Pump Education: Setup, frequency, and cleaning;Milk Storage ?Reason for Pumping: stimulation during sleepy period (Baby is at [facility] w/ 2 feedings. He received circumcision this morning and has been sleepy since) ?Pumping frequency: q 2-3 hours (advised to continue routine until baby feeding regularly) ? ?Interventions ?Interventions: Breast feeding basics reviewed;Assisted with latch;Hand express;Adjust  position;Support pillows;Position options;Education (nipple shield use, assisted with feeding/putting baby to breast, directed how to position baby with shield (chin first), wide mouth and full breast tissue) ? ?Encouragement and tips given for keeping baby awake at the breast. Encouraged continued frequent pumping until frequency picks-up. Anticipatory guidance given for cluster feeding tonight. ? ?Discharge ?Pump: Personal ? ?Consult Status ?Consult Status: Follow-up ? ? ? ?Lavonia Drafts ?02/28/2022, 5:10 PM ? ? ? ?

## 2022-02-28 NOTE — Lactation Note (Signed)
This note was copied from a baby's chart. ?Lactation Consultation Note ? ?Patient Name: Tracy Morse ?Today's Date: 02/28/2022 ?Reason for consult: Follow-up assessment;Primapara;Term ?Age:27 hours ? ?Maternal Data ?Has patient been taught Hand Expression?: Yes ?Does the patient have breastfeeding experience prior to this delivery?: No ? ?Feeding ?Mother's Current Feeding Choice: Breast Milk ? ?Baby remains sleepy even post bath. Mom and dad tried independently but report baby was sleepy and not interested.  ?For stimulation and hopeful colostrum removal LC set-up DEBP at beside, encouraged frequent and ongoing use until baby starts to wake more frequently. ? ?LATCH Score ?  ? ?Lactation Tools Discussed/Used ?Tools: Pump;Nipple Jefferson Fuel ?Nipple shield size: 20 ?Breast pump type: Double-Electric Breast Pump ?Pump Education: Setup, frequency, and cleaning;Milk Storage ?Reason for Pumping: stimulation during sleepy period (Baby is at [facility] w/ 2 feedings. He received circumcision this morning and has been sleepy since) ?Pumping frequency: q 2-3 hours (advised to continue routine until baby feeding regularly) ? ?Interventions ?Interventions: Skin to skin;Education;DEBP (Pumping frequency, importance of skin to skin, hand express post pumping for optimal output) ? ?Revisited the potential for cluster feeding overnight tonight. Encouraged on demand feedings each time with early cues. ? ?Discharge ?Pump: Personal ? ?Consult Status ?Consult Status: Follow-up ? ? ? ?Lavonia Drafts ?02/28/2022, 3:18 PM ? ? ? ?

## 2022-03-01 MED ORDER — SERTRALINE HCL 50 MG PO TABS: 50.0000 mg | ORAL_TABLET | Freq: Every day | ORAL | 11 refills | Status: DC

## 2022-03-01 MED ORDER — SERTRALINE HCL 25 MG PO TABS
50.0000 mg | ORAL_TABLET | Freq: Every day | ORAL | Status: DC
  Administered 2022-03-01: 50 mg via ORAL
  Filled 2022-03-01: qty 2

## 2022-03-01 NOTE — Progress Notes (Signed)
Patient discharged with infant. Discharge instructions, prescriptions, and follow up appointments given to and reviewed with patient. Patient verbalized understanding. Escorted out by staff. 

## 2022-05-26 IMAGING — RF DG HYSTEROGRAM
2 series · 2 of 2 positions shown · IV contrast (omnipaque)
Comparison: None.

CLINICAL DATA: Infertility

EXAM:
HYSTEROSALPINGOGRAM
TECHNIQUE: Following cleansing of the cervix and vagina with Betadine solution,
a hysterosalpingogram was performed using a hysterosalpingogram
catheter placed by Dr. Vardi and Omnipaque 300 contrast.

[Series 1: cp_standard · 0.17mm/px · 1 of 1 slices shown (1 of 2)]
[im 1/1]
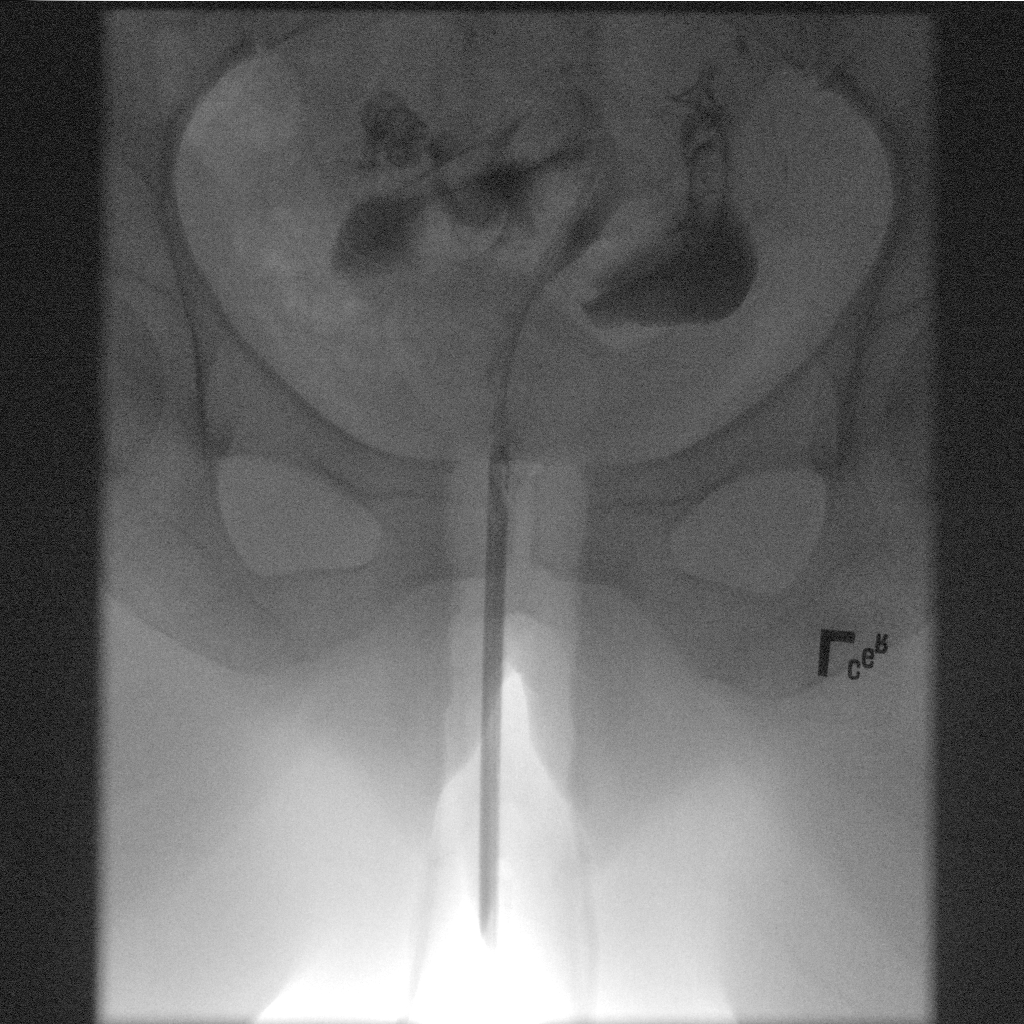

[Series 2: cp_standard · 0.17mm/px · 1 of 1 slices shown (2 of 2)]
[im 1/1]
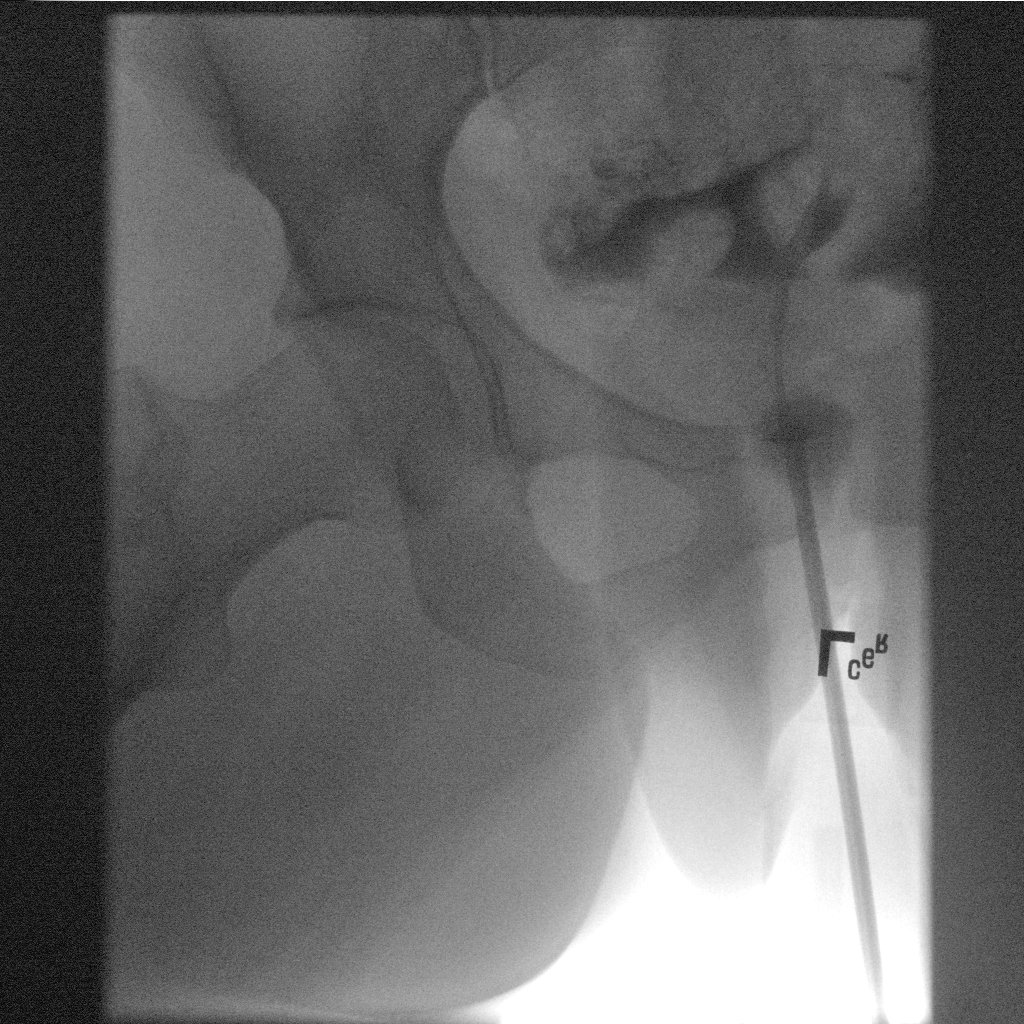

[2 of 2 positions shown; findings below may reference images not displayed]

FLUOROSCOPY TIME:  Radiation Exposure Index (as provided by the
fluoroscopic device): 2.6 mGy

Fluoroscopy Time:  18 seconds
FINDINGS: Injection of contrast demonstrates filling of the endometrial canal
with no contour abnormality or filling defect. There is bilateral
tubal spill indicating patency.
IMPRESSION: Bilateral tubal patency.

## 2023-07-12 ENCOUNTER — Other Ambulatory Visit: Payer: Self-pay | Admitting: Obstetrics and Gynecology

## 2023-07-12 MED ORDER — CHLORHEXIDINE GLUCONATE 0.12 % MT SOLN
15.0000 mL | Freq: Once | OROMUCOSAL | Status: AC
  Administered 2023-07-13: 15 mL via OROMUCOSAL

## 2023-07-12 MED ORDER — DOXYCYCLINE HYCLATE 100 MG PO TABS
200.0000 mg | ORAL_TABLET | Freq: Once | ORAL | Status: AC
  Administered 2023-07-13: 200 mg via ORAL
  Filled 2023-07-12 (×2): qty 2

## 2023-07-12 MED ORDER — ACETAMINOPHEN 500 MG PO TABS
1000.0000 mg | ORAL_TABLET | ORAL | Status: AC
  Administered 2023-07-13: 1000 mg via ORAL

## 2023-07-12 MED ORDER — LACTATED RINGERS IV SOLN: INTRAVENOUS | Status: DC

## 2023-07-12 MED ORDER — ORAL CARE MOUTH RINSE: 15.0000 mL | Freq: Once | OROMUCOSAL | Status: AC

## 2023-07-12 MED ORDER — POVIDONE-IODINE 10 % EX SWAB: 2.0000 | Freq: Once | CUTANEOUS | Status: DC

## 2023-07-12 NOTE — H&P (Signed)
GYN H&P Name: Tracy Morse MRN: [id number] Date of Service: 07/13/2023   CC: suction D&C for MAB   HPI: Tracy Morse 27 y.o. G2P1001 at [redacted]w[redacted]d by -/6 with a hx of infertility who presents for suction D&C for missed abortion measuring [redacted]w[redacted]d. Seen yesterday in clinic and diagnosed with MAB. Ultrasound in clinic 9/23 showed a fetus measuring [redacted]w[redacted]d and no cardiac activity.    LMP: 04/19/23, EDD 01/24/24 U/s: 06/15/23, [redacted]w[redacted]d, EDD 02/03/24   ROS: All other systems reviewed and negative   GYN Hx: - LMP: Patient's last menstrual period was 04/19/2023 (exact date).  - Menarche: Age 69 - Menses: q32-43 days, 4-5d of bleeding - Pap hx: last NILM with neg HRHPV 03/29/23. Never had any excisional procedures - Abdominal surgeries: none     OB History  Gravida Para Term Preterm AB Living  1 1 1     1   SAB IAB Ectopic Multiple Live Births        0 1    # Outcome Date GA Lbr Len/2nd Weight Sex Type Anes PTL Lv  1 Term 02/27/22 [redacted]w[redacted]d 07:19 / 04:40 3760 g M Vag-Spont EPI  LIV     PMH:  Past Medical History:  Diagnosis Date   Anemia    Anorexia    Anxiety    Migraine      PSH:  History reviewed. No pertinent surgical history.   Meds:  Prior to Admission medications   Medication Sig Start Date End Date Taking? Authorizing Provider  ferrous sulfate 325 (65 FE) MG EC tablet Take 325 mg by mouth 3 (three) times daily with meals. Patient not taking: Reported on 02/06/2022    [provider]  Prenatal Vit-Fe Fumarate-FA (MULTIVITAMIN-PRENATAL) 27-0.8 MG TABS tablet Take 1 tablet by mouth daily at 12 noon.    [provider]  sertraline (ZOLOFT) 50 MG tablet Take 1 tablet (50 mg total) by mouth daily. 03/01/22   Haroldine Laws, CNM     Allergies:  Allergies  Allergen Reactions   Cephalosporins Rash   Dicloxacillin Sodium Itching     FH:  Family History  Problem Relation Age of Onset   Colon polyps Mother    Hyperlipidemia Mother    Anxiety disorder Mother     Colon polyps Father    Alcohol abuse Father    High blood pressure Father    Hyperlipidemia Father    Colon cancer Maternal Grandmother    Coronary artery disease Maternal Grandfather    Colon cancer Paternal Grandmother    Coronary artery disease Paternal Grandfather      SH:  Social History   Socioeconomic History   Marital status: Married    Spouse name: Not on file   Number of children: Not on file   Years of education: Not on file   Highest education level: Not on file  Occupational History   Not on file  Tobacco Use   Smoking status: Never   Smokeless tobacco: Never  Vaping Use   Vaping status: Never Used  Substance and Sexual Activity   Alcohol use: Never   Drug use: Never   Sexual activity: Yes    Birth control/protection: None  Other Topics Concern   Not on file  Social History Narrative   Not on file   Social Determinants of Health   Financial Resource Strain: Low Risk  (09/21/2022)   Received from [facility]   Overall Financial Resource Strain (CARDIA)    Difficulty of Paying  Living Expenses: Not hard at all  Food Insecurity: No Food Insecurity (09/21/2022)   Received from [facility]   Hunger Vital Sign    Worried About Running Out of Food in the Last Year: Never true    Ran Out of Food in the Last Year: Never true  Transportation Needs: No Transportation Needs (09/21/2022)   Received from [facility] - Transportation    In the past 12 months, has lack of transportation kept you from medical appointments or from getting medications?: No    Lack of Transportation (Non-Medical): No  Physical Activity: Not on file  Stress: Not on file  Social Connections: Not on file  Intimate Partner Violence: Not on file     Physical Exam: Vitals:   07/13/23 0730  BP: (!) 146/73  Pulse: 78  Resp: 16  Temp: 98.1 F (36.7 C)  SpO2: 100%   Body mass index is 31.28 kg/m.   General: Alert,  NAD HEENT: Mucus membranes moist.  CV: Regular rate Resp: No increased work of breathing Abd: Soft, nontender to palpation Skin: No rashes.  Ext: No peripheral edema. Psych: Appropriate affect.    Labs: Hb 13.7 (9/23)    Radiology: Ultrasound- 07/12/23 Dating / Viability   Pregnancy =========   Singleton pregnancy. Number of fetuses: 1   Dating ======          Date    Details     Gest. age   EDD LMP    04/19/2023                12 w + 0 d  01/24/2024 U/S    07/12/2023   based upon CRL      9 w + 1 d   02/13/2024 Assigned dating    based on ultrasound (CRL), selected on 06/15/2023       10 w + 4 d  02/03/2024   Assessment ==========   Gestational sac: Visualized. Location: intrauterine Yolk sac: visualized Embryo: Visualized Cardiac activity: See impression CRL    25.4    mm      9w 1d   [place]   Maternal Structures ===============   Uterus Visualized Cervix Visualized        Approach - Transabdominal Right Ovary    Visualized Left Ovary Visualized [place] No free fluid visualized   Impression =========   Single intrauterine pregnancy with estimated gestational age of [redacted] weeks, 4 days. CRL of 25.76mm today corresponds to [redacted]w[redacted]d and no fetal cardiac activity could be identified using 2D imaging or color flow doppler.   Assessment/Plan: Tracy Morse 27 y.o. G2P1001 at [redacted]w[redacted]d by -/6 with a hx of infertility who presents for suction D&C for MAB measuring [redacted]w[redacted]d.   #MAB - Pregnancy was dated on a 6 week u/s. Based on dating should have been [redacted]w[redacted]d yesterday on 9/23 however measured [redacted]w[redacted]d and no cardiac activity. - A+ - Hb 13.7 in clinic yesterday - T&S in clinic yesterday - Discussed options including expectant management, medical management, and surgical management and patient opted for surgical management via suction D&C. Discussed risks of surgical management including bleeding, infection, adjacent organ damage, anesthetic complications, possibility of future Asherman's  syndrome.  - Re-discussed planned surgical procedure- suction dilation and curettage.  Surgical consent signed in clinic yesterday. Patient will receive blood products if needed. Blood consent signed yesterday.  - Patient to receive doxy 200 mg pre-op - Previously sent Ibuprofen and zofran to pharmacy  for post-op - Patient to return for for post-op visit in 2 weeks   Electronically signed: Romana Juniper, MD, 07/13/2023 8:44 AM

## 2023-07-13 ENCOUNTER — Ambulatory Visit
Admission: RE | Admit: 2023-07-13 | Discharge: 2023-07-13 | Disposition: A | Discharge status: Home / Self Care | Payer: BC Managed Care – PPO | Attending: Obstetrics and Gynecology | Admitting: Obstetrics and Gynecology

## 2023-07-13 ENCOUNTER — Ambulatory Visit: Payer: BC Managed Care – PPO | Admitting: General Practice

## 2023-07-13 ENCOUNTER — Emergency Department
Admission: EM | Admit: 2023-07-13 | Discharge: 2023-07-13 | Disposition: A | Discharge status: Home / Self Care | Payer: BC Managed Care – PPO | Source: Home / Self Care | Attending: Emergency Medicine | Admitting: Emergency Medicine

## 2023-07-13 ENCOUNTER — Encounter: Payer: Self-pay | Admitting: Obstetrics and Gynecology

## 2023-07-13 ENCOUNTER — Other Ambulatory Visit: Payer: Self-pay

## 2023-07-13 ENCOUNTER — Emergency Department: Payer: BC Managed Care – PPO

## 2023-07-13 ENCOUNTER — Encounter
Admission: RE | Disposition: A | Discharge status: Home / Self Care | Payer: Self-pay | Source: Home / Self Care | Attending: Obstetrics and Gynecology

## 2023-07-13 DIAGNOSIS — O99341 Other mental disorders complicating pregnancy, first trimester: Secondary | ICD-10-CM | POA: Insufficient documentation

## 2023-07-13 DIAGNOSIS — Z9889 Other specified postprocedural states: Secondary | ICD-10-CM

## 2023-07-13 DIAGNOSIS — F419 Anxiety disorder, unspecified: Secondary | ICD-10-CM | POA: Diagnosis not present

## 2023-07-13 DIAGNOSIS — O0389 Complete or unspecified spontaneous abortion with other complications: Secondary | ICD-10-CM | POA: Insufficient documentation

## 2023-07-13 DIAGNOSIS — Z3A10 10 weeks gestation of pregnancy: Secondary | ICD-10-CM | POA: Diagnosis not present

## 2023-07-13 DIAGNOSIS — R102 Pelvic and perineal pain: Secondary | ICD-10-CM | POA: Insufficient documentation

## 2023-07-13 DIAGNOSIS — O021 Missed abortion: Secondary | ICD-10-CM | POA: Diagnosis present

## 2023-07-13 DIAGNOSIS — N939 Abnormal uterine and vaginal bleeding, unspecified: Secondary | ICD-10-CM | POA: Insufficient documentation

## 2023-07-13 DIAGNOSIS — N854 Malposition of uterus: Secondary | ICD-10-CM | POA: Insufficient documentation

## 2023-07-13 HISTORY — PX: DILATION AND EVACUATION: SHX1459

## 2023-07-13 LAB — CBC
HCT: 39.6 % (ref 36.0–46.0)
HCT: 40.9 % (ref 36.0–46.0)
Hemoglobin: 13.5 g/dL (ref 12.0–15.0)
Hemoglobin: 14.1 g/dL (ref 12.0–15.0)
MCH: 29.3 pg (ref 26.0–34.0)
MCH: 29.4 pg (ref 26.0–34.0)
MCHC: 34.1 g/dL (ref 30.0–36.0)
MCHC: 34.5 g/dL (ref 30.0–36.0)
MCV: 85.9 fL (ref 80.0–100.0)
MCV: 85.2 fL (ref 80.0–100.0)
Platelets: 248 10*3/uL (ref 150–400)
Platelets: 288 10*3/uL (ref 150–400)
RBC: 4.61 MIL/uL (ref 3.87–5.11)
RBC: 4.8 MIL/uL (ref 3.87–5.11)
RDW: 12.8 % (ref 11.5–15.5)
RDW: 12.7 % (ref 11.5–15.5)
WBC: 7.4 10*3/uL (ref 4.0–10.5)
WBC: 12.1 10*3/uL — ABNORMAL HIGH (ref 4.0–10.5)
nRBC: 0 % (ref 0.0–0.2)
nRBC: 0 % (ref 0.0–0.2)

## 2023-07-13 LAB — TYPE AND SCREEN
ABO/RH(D): A POS
ABO/RH(D): A POS
Antibody Screen: NEGATIVE
Antibody Screen: NEGATIVE

## 2023-07-13 LAB — POC URINE PREG, ED: Preg Test, Ur: POSITIVE — AB

## 2023-07-13 SURGERY — DILATION AND EVACUATION, UTERUS
Anesthesia: General

## 2023-07-13 MED ORDER — ONDANSETRON HCL 4 MG/2ML IJ SOLN
4.0000 mg | Freq: Once | INTRAMUSCULAR | Status: AC
  Administered 2023-07-13: 4 mg via INTRAVENOUS

## 2023-07-13 MED ORDER — MIDAZOLAM HCL 2 MG/2ML IJ SOLN
INTRAMUSCULAR | Status: AC
  Filled 2023-07-13: qty 2

## 2023-07-13 MED ORDER — OXYCODONE HCL 5 MG PO TABS: 5.0000 mg | ORAL_TABLET | Freq: Once | ORAL | Status: DC | PRN

## 2023-07-13 MED ORDER — ACETAMINOPHEN 650 MG RE SUPP: 650.0000 mg | RECTAL | Status: DC | PRN

## 2023-07-13 MED ORDER — IBUPROFEN 800 MG PO TABS
800.0000 mg | ORAL_TABLET | Freq: Three times a day (TID) | ORAL | Status: DC
  Filled 2023-07-13: qty 1

## 2023-07-13 MED ORDER — LIDOCAINE HCL (CARDIAC) PF 100 MG/5ML IV SOSY
PREFILLED_SYRINGE | INTRAVENOUS | Status: DC | PRN
  Administered 2023-07-13: 60 mg via INTRAVENOUS

## 2023-07-13 MED ORDER — DOXYCYCLINE HYCLATE 100 MG PO TABS: 200.0000 mg | ORAL_TABLET | Freq: Once | ORAL | Status: DC

## 2023-07-13 MED ORDER — SILVER NITRATE-POT NITRATE 75-25 % EX MISC
CUTANEOUS | Status: DC | PRN
  Administered 2023-07-13: 2

## 2023-07-13 MED ORDER — SODIUM CHLORIDE 0.9% FLUSH: 3.0000 mL | Freq: Two times a day (BID) | INTRAVENOUS | Status: DC

## 2023-07-13 MED ORDER — SILVER NITRATE-POT NITRATE 75-25 % EX MISC
CUTANEOUS | Status: AC
  Filled 2023-07-13: qty 10

## 2023-07-13 MED ORDER — PROPOFOL 10 MG/ML IV BOLUS
INTRAVENOUS | Status: AC
  Filled 2023-07-13: qty 40

## 2023-07-13 MED ORDER — PROPOFOL 10 MG/ML IV BOLUS
INTRAVENOUS | Status: DC | PRN
Start: 2023-07-13 — End: 2023-07-13
  Administered 2023-07-13: 200 mg via INTRAVENOUS

## 2023-07-13 MED ORDER — DEXAMETHASONE SODIUM PHOSPHATE 10 MG/ML IJ SOLN
INTRAMUSCULAR | Status: DC | PRN
  Administered 2023-07-13: 10 mg via INTRAVENOUS

## 2023-07-13 MED ORDER — CHLORHEXIDINE GLUCONATE 0.12 % MT SOLN
OROMUCOSAL | Status: AC
  Filled 2023-07-13: qty 15

## 2023-07-13 MED ORDER — ACETAMINOPHEN 500 MG PO TABS
ORAL_TABLET | ORAL | Status: AC
  Filled 2023-07-13: qty 2

## 2023-07-13 MED ORDER — OXYCODONE HCL 5 MG/5ML PO SOLN: 5.0000 mg | Freq: Once | ORAL | Status: DC | PRN

## 2023-07-13 MED ORDER — FENTANYL CITRATE (PF) 100 MCG/2ML IJ SOLN: 25.0000 ug | INTRAMUSCULAR | Status: DC | PRN

## 2023-07-13 MED ORDER — PROPOFOL 500 MG/50ML IV EMUL
INTRAVENOUS | Status: DC | PRN
  Administered 2023-07-13: 100 ug/kg/min via INTRAVENOUS

## 2023-07-13 MED ORDER — OXYCODONE HCL 5 MG PO TABS: 5.0000 mg | ORAL_TABLET | ORAL | Status: DC | PRN

## 2023-07-13 MED ORDER — METHYLERGONOVINE MALEATE 0.2 MG/ML IJ SOLN
INTRAMUSCULAR | Status: AC
  Filled 2023-07-13: qty 1

## 2023-07-13 MED ORDER — METHYLERGONOVINE MALEATE 0.2 MG PO TABS
0.2000 mg | ORAL_TABLET | Freq: Once | ORAL | Status: AC
  Administered 2023-07-13: 0.2 mg via ORAL
  Filled 2023-07-13: qty 1

## 2023-07-13 MED ORDER — ONDANSETRON HCL 4 MG/2ML IJ SOLN
INTRAMUSCULAR | Status: AC
  Filled 2023-07-13: qty 2

## 2023-07-13 MED ORDER — SODIUM CHLORIDE 0.9 % IV SOLN: 250.0000 mL | INTRAVENOUS | Status: DC | PRN

## 2023-07-13 MED ORDER — 0.9 % SODIUM CHLORIDE (POUR BTL) OPTIME
TOPICAL | Status: DC | PRN
  Administered 2023-07-13: 500 mL

## 2023-07-13 MED ORDER — FENTANYL CITRATE (PF) 100 MCG/2ML IJ SOLN
INTRAMUSCULAR | Status: AC
  Filled 2023-07-13: qty 2

## 2023-07-13 MED ORDER — MIDAZOLAM HCL 2 MG/2ML IJ SOLN
INTRAMUSCULAR | Status: DC | PRN
  Administered 2023-07-13: 2 mg via INTRAVENOUS

## 2023-07-13 MED ORDER — ONDANSETRON HCL 4 MG/2ML IJ SOLN
INTRAMUSCULAR | Status: DC | PRN
  Administered 2023-07-13: 4 mg via INTRAVENOUS

## 2023-07-13 MED ORDER — SODIUM CHLORIDE 0.9% FLUSH: 3.0000 mL | INTRAVENOUS | Status: DC | PRN

## 2023-07-13 MED ORDER — FENTANYL CITRATE (PF) 100 MCG/2ML IJ SOLN
INTRAMUSCULAR | Status: DC | PRN
  Administered 2023-07-13 (×2): 50 ug via INTRAVENOUS

## 2023-07-13 MED ORDER — PROPOFOL 1000 MG/100ML IV EMUL
INTRAVENOUS | Status: AC
  Filled 2023-07-13: qty 100

## 2023-07-13 MED ORDER — METHYLERGONOVINE MALEATE 0.2 MG PO TABS: 0.2000 mg | ORAL_TABLET | Freq: Four times a day (QID) | ORAL | 0 refills | Status: AC

## 2023-07-13 MED ORDER — SUCCINYLCHOLINE CHLORIDE 200 MG/10ML IV SOSY
PREFILLED_SYRINGE | INTRAVENOUS | Status: DC | PRN
  Administered 2023-07-13: 100 mg via INTRAVENOUS

## 2023-07-13 MED ORDER — METHYLERGONOVINE MALEATE 0.2 MG/ML IJ SOLN
0.2000 mg | Freq: Once | INTRAMUSCULAR | Status: AC
  Administered 2023-07-13: 0.2 mg via INTRAMUSCULAR
  Filled 2023-07-13 (×2): qty 1

## 2023-07-13 MED ORDER — ACETAMINOPHEN 325 MG PO TABS: 650.0000 mg | ORAL_TABLET | ORAL | Status: DC | PRN

## 2023-07-13 SURGICAL SUPPLY — 34 items
CNTNR URN SCR LID CUP LEK RST (MISCELLANEOUS) IMPLANT
CONT SPEC 4OZ STRL OR WHT (MISCELLANEOUS) ×1
DRSG TELFA 3X8 NADH STRL (GAUZE/BANDAGES/DRESSINGS) IMPLANT
DRSG TELFA 4X3 1S NADH ST (GAUZE/BANDAGES/DRESSINGS) IMPLANT
FILTER UTR ASPR ASSEMBLY (MISCELLANEOUS) ×1 IMPLANT
FILTER UTR ASPR SPEC (MISCELLANEOUS) ×1 IMPLANT
FLTR UTR ASPR SPEC (MISCELLANEOUS) ×1
GLOVE SURG SYN 7.0 (GLOVE) ×1 IMPLANT
GLOVE SURG SYN 7.0 PF PI (GLOVE) IMPLANT
GLOVE SURG SYN 8.0 (GLOVE) IMPLANT
GLOVE SURG SYN 8.0 PF PI (GLOVE) ×1 IMPLANT
GOWN STRL REUS W/ TWL LRG LVL3 (GOWN DISPOSABLE) ×1 IMPLANT
GOWN STRL REUS W/ TWL XL LVL3 (GOWN DISPOSABLE) ×1 IMPLANT
GOWN STRL REUS W/TWL LRG LVL3 (GOWN DISPOSABLE) ×1
GOWN STRL REUS W/TWL XL LVL3 (GOWN DISPOSABLE) ×1
KIT BERKELEY 1ST TRIMESTER 3/8 (MISCELLANEOUS) ×1 IMPLANT
KIT TURNOVER CYSTO (KITS) ×1 IMPLANT
MANIFOLD NEPTUNE II (INSTRUMENTS) ×1 IMPLANT
NS IRRIG 500ML POUR BTL (IV SOLUTION) IMPLANT
PACK DNC HYST (MISCELLANEOUS) ×1 IMPLANT
PAD OB MATERNITY 4.3X12.25 (PERSONAL CARE ITEMS) ×1 IMPLANT
PAD PREP OB/GYN DISP 24X41 (PERSONAL CARE ITEMS) ×1 IMPLANT
SCRUB CHG 4% DYNA-HEX 4OZ (MISCELLANEOUS) ×1 IMPLANT
SET BERKELEY SUCTION TUBING (SUCTIONS) ×1 IMPLANT
SET CYSTO W/LG BORE CLAMP LF (SET/KITS/TRAYS/PACK) IMPLANT
TOWEL OR 17X26 4PK STRL BLUE (TOWEL DISPOSABLE) ×1 IMPLANT
TRAP FLUID SMOKE EVACUATOR (MISCELLANEOUS) ×1 IMPLANT
TRAP TISSUE FILTER (MISCELLANEOUS) ×1 IMPLANT
VACURETTE 10 RIGID CVD (CANNULA) ×1 IMPLANT
VACURETTE 6 ASPIR F TIP BERK (CANNULA) ×1 IMPLANT
VACURETTE 7MM F TIP STRL (CANNULA) ×1 IMPLANT
VACURETTE 8 RIGID CVD (CANNULA) ×1 IMPLANT
VACURETTE 8MM F TIP (MISCELLANEOUS) ×1 IMPLANT
WATER STERILE IRR 500ML POUR (IV SOLUTION) ×1 IMPLANT

## 2023-07-13 NOTE — OR Nursing (Signed)
Microarray test kit was provided by D. Schermerhorn, MD. Specimen for kit was retrieved and taken by MD. Girtha Rm of specimen was collected and sent to [facility] pathology for routine surgical pathology.

## 2023-07-13 NOTE — Op Note (Signed)
Operative Note    Date: 07/13/2023 Preoperative diagnosis: Missed abortion Postoperative diagnosis: Same   Procedure: Suction Dilation & Curettage  Surgeon: Kathalene Frames, MD Anesthesia: General   EBL:250cc IVF:700cc UOP: N/a   Complications: none  Specimens: products of conception to pathology, products of conception to [place] for [facility].    Findings: Ultrasound confirmed anteverted uterus with nonviable fetus (no cardiac activity). Anteverted uterus that was sounded to 9 cm, multiparous dilated cervix, normal appearing endocervical canal. Good amount of endometrial tissue obtained with curetting. Transabdominal US was used prior to and after the case.   Procedure:  The risks, benefits, indications, and alternative of the procedure were reviewed with the patient in pre-operative area and informed consent was obtained. PO doxycycline 200 mg was administered. The patient was taken to the operating room where general endotracheal anesthesia was achieved without difficulty.  She was placed in the dorsal lithotomy position in [place] stirrups.  She was prepped and draped in the usual sterile fashion. Bedside ultrasound was used to visualize a 9 week IUP with no fetal heartbeat.   The cervix was grasped with a tenaculum.  The uterus was sounded to approximately 9cm.  The cervix was dilated with Shawnie Pons dilators up to 31 [place].  A 10mm suction curette was used.  The curette was advanced to the fundus of the uterus.  It was pulled back slightly and the suction device was activated. The contents of the uterus were evacuated with four passes. Gentle sharp curettage was then performed until the endometrium felt appropriately gritty. The ultrasound was used to confirm a thin endometrial stripe.  No active bleeding from the cervix was noted at the end of the procedure. The tenaculum was removed . Silver nitrate was applied to tenaculum sites which were then noted to be hemostatic. The speculum was  removed.    Sponge, lap and needle counts were correct times two. Anesthesia was reversed successfully and the patient was taken to the PACU in stable condition.  Genetic testing requested by patient. ANORA miscarriage test used. Sample separated and placed in test kit. Remainder of sample sent to pathology.    Romana Juniper, MD, 07/13/2023, 8:46 AM

## 2023-07-13 NOTE — Anesthesia Preprocedure Evaluation (Addendum)
Anesthesia Evaluation  Patient identified by MRN, date of birth, ID band Patient awake    Reviewed: Allergy & Precautions, NPO status , Patient's Chart, lab work & pertinent test results  Airway Mallampati: III  TM Distance: >3 FB Neck ROM: full    Dental  (+) Chipped, Dental Advidsory Given   Pulmonary neg pulmonary ROS   Pulmonary exam normal        Cardiovascular negative cardio ROS Normal cardiovascular exam     Neuro/Psych  PSYCHIATRIC DISORDERS Anxiety     negative neurological ROS     GI/Hepatic negative GI ROS, Neg liver ROS,,,  Endo/Other  negative endocrine ROS    Renal/GU negative Renal ROS  negative genitourinary   Musculoskeletal   Abdominal   Peds  Hematology negative hematology ROS (+)   Anesthesia Other Findings Past Medical History: No date: Anemia No date: Anorexia No date: Anxiety No date: Migraine  History reviewed. No pertinent surgical history.  BMI    Body Mass Index: 31.28 kg/m      Reproductive/Obstetrics negative OB ROS                             Anesthesia Physical Anesthesia Plan  ASA: 2  Anesthesia Plan: General   Post-op Pain Management: Minimal or no pain anticipated   Induction: Intravenous  PONV Risk Score and Plan: 3 and Propofol infusion, TIVA, Ondansetron and Midazolam  Airway Management Planned: Oral ETT  Additional Equipment: None  Intra-op Plan:   Post-operative Plan:   Informed Consent: I have reviewed the patients History and Physical, chart, labs and discussed the procedure including the risks, benefits and alternatives for the proposed anesthesia with the patient or authorized representative who has indicated his/her understanding and acceptance.     Dental advisory given  Plan Discussed with: CRNA and Surgeon  Anesthesia Plan Comments: (Discussed risks of anesthesia with patient, including possibility of difficulty  with spontaneous ventilation under anesthesia necessitating airway intervention, PONV, and rare risks such as cardiac or respiratory or neurological events, and allergic reactions. Discussed the role of CRNA in patient's perioperative care. Patient understands.)       Anesthesia Quick Evaluation

## 2023-07-13 NOTE — Discharge Instructions (Addendum)
AMBULATORY SURGERY  DISCHARGE INSTRUCTIONS   The drugs that you were given will stay in your system until tomorrow so for the next 24 hours you should not:  Drive an automobile Make any legal decisions Drink any alcoholic beverage   You may resume regular meals tomorrow.  Today it is better to start with liquids and gradually work up to solid foods.  You may eat anything you prefer, but it is better to start with liquids, then soup and crackers, and gradually work up to solid foods.   Please notify your doctor immediately if you have any unusual bleeding, trouble breathing, redness and pain at the surgery site, drainage, fever, or pain not relieved by medication.       Please contact your physician with any problems or Same Day Surgery at [phone], Monday through Friday 6 am to 4 pm, or [place] [facility] [place] Main number at [phone].  

## 2023-07-13 NOTE — Transfer of Care (Signed)
Immediate Anesthesia Transfer of Care Note  Patient: Tracy Morse  Procedure(s) Performed: SUCTION D&C  Patient Location: PACU  Anesthesia Type:General  Level of Consciousness: drowsy  Airway & Oxygen Therapy: Patient Spontanous Breathing and Patient connected to face mask oxygen  Post-op Assessment: Report given to RN and Post -op Vital signs reviewed and stable  Post vital signs: stable  Last Vitals:  Vitals Value Taken Time  BP    Temp 36.2 C 07/13/23 1014  Pulse 99 07/13/23 1015  Resp 26 07/13/23 1015  SpO2 100 % 07/13/23 1015  Vitals shown include unfiled device data.  Last Pain:  Vitals:   07/13/23 0730  TempSrc: Oral  PainSc: 0-No pain         Complications: No notable events documented.

## 2023-07-13 NOTE — ED Provider Notes (Signed)
[facility] Provider Note    Event Date/Time   First MD Initiated Contact with Patient 07/13/23 1815     (approximate)   History   Vaginal Bleeding   HPI Tracy Morse is a 27 y.o. female who underwent D&C today who presents today for vaginal bleeding.  Patient underwent D&C for missed abortion at 9 weeks and 1 day with Dr. Feliberto Gottron.  Had initial spotting postoperatively which she knew was normal.  A couple hours after the procedure she then had a gush of vaginal bleeding.  Noticed when standing.  Has had increased vaginal bleeding since then changing pads every 30 to 60 minutes.  Has mild abdominal cramping.  1 episode of lightheadedness but none currently.  No other fevers or chills.     Physical Exam   Triage Vital Signs: ED Triage Vitals  Encounter Vitals Group     BP 07/13/23 1727 134/77     Systolic BP Percentile --      Diastolic BP Percentile --      Pulse Rate 07/13/23 1727 96     Resp 07/13/23 1727 20     Temp 07/13/23 1727 98.2 F (36.8 C)     Temp Source 07/13/23 1727 Oral     SpO2 07/13/23 1727 97 %     Weight 07/13/23 1728 171 lb (77.6 kg)     Height 07/13/23 1728 5\' 2"  (1.575 m)     Head Circumference --      Peak Flow --      Pain Score 07/13/23 1728 5     Pain Loc --      Pain Education --      Exclude from Growth Chart --     Most recent vital signs: Vitals:   07/13/23 1727 07/13/23 1913  BP: 134/77 123/67  Pulse: 96 74  Resp: 20 16  Temp: 98.2 F (36.8 C) 98.2 F (36.8 C)  SpO2: 97% 98%   I have reviewed the vital signs. General:  Awake, alert, no acute distress. Head:  Normocephalic, Atraumatic. EENT:  PERRL, EOMI, Oral mucosa pink and moist, Neck is supple. Cardiovascular: Regular rate, 2+ distal pulses. Respiratory:  Normal respiratory effort, symmetrical expansion, no distress.   Abdomen: Mild abdominal tenderness throughout which she associates with cramping. GU: Pelvic exam shows pooling blood  within the vaginal vault.  Slight trickle of blood coming from the cervical os but no overt heavy bleeding. Extremities:  Moving all four extremities through full ROM without pain.   Neuro:  Alert and oriented.  Interacting appropriately.   Skin:  Warm, dry, no rash.   Psych: Appropriate affect.     ED Results / Procedures / Treatments   Labs (all labs ordered are listed, but only abnormal results are displayed) Labs Reviewed  CBC - Abnormal; Notable for the following components:      Result Value   WBC 12.1 (*)    All other components within normal limits  POC URINE PREG, ED - Abnormal; Notable for the following components:   Preg Test, Ur Positive (*)    All other components within normal limits  TYPE AND SCREEN     EKG    RADIOLOGY    PROCEDURES:  Critical Care performed: No  Procedures   MEDICATIONS ORDERED IN ED: Medications  methylergonovine (METHERGINE) injection 0.2 mg (0.2 mg Intramuscular Given 07/13/23 1935)     IMPRESSION / MDM / ASSESSMENT AND PLAN / ED COURSE  I reviewed the  triage vital signs and the nursing notes.                              Differential diagnosis includes, but is not limited to, uterine hemorrhage, postsurgical bleeding.  Patient's presentation is most consistent with acute presentation with potential threat to life or bodily function.  Patient is a 27 year old female presenting today for vaginal bleeding for following dilation and curettage with gynecology today.  Patient is stable on arrival with stable vital signs.  Pelvic exam does show blood within the vaginal vault and only slight trickle from the cervical os but no significant vaginal bleeding.  Discussed the case with gynecologist Dr. Feliberto Gottron who came in and evaluated patient.  Is reassured by the vaginal bleeding at this time which is not significant.  Patient was given Methergine 0.2 mg here in the ED.  She will be discharged with Methergine 0.2 mg every 6 hours x  3 doses outpatient.  She will follow-up with gynecology outpatient as well.  Patient was given strict return precautions and agreeable with plan.  The patient is on the cardiac monitor to evaluate for evidence of arrhythmia and/or significant heart rate changes. Clinical Course as of 07/13/23 2141  Tue Jul 13, 2023  1836 Spoke with Dr. Feliberto Gottron - Give Methergine 0.2 IM now, pelvic exam. Methergine 0.2mg  q6 hours for 24 hours if not actively bleeding at this time [DW]    Clinical Course User Index [DW] Janith Lima, MD     FINAL CLINICAL IMPRESSION(S) / ED DIAGNOSES   Final diagnoses:  Vaginal bleeding  S/P dilation and curettage     Rx / DC Orders   ED Discharge Orders          Ordered    methylergonovine (METHERGINE) 0.2 MG tablet  Every 6 hours        07/13/23 2110             Note:  This document was prepared using Dragon voice recognition software and may include unintentional dictation errors.   Janith Lima, MD 07/13/23 [phone]

## 2023-07-13 NOTE — ED Notes (Signed)
See triage notes. Patient had a D&C this morning. Patient was at home around 1600 today and saturated multiple pads.

## 2023-07-13 NOTE — Discharge Instructions (Signed)
You were seen in the emergency department today for your vaginal bleeding.  Gynecology team has seen you and prescribed medication to go home with.  Please follow-up with them as planned.  Please return to the emergency department for any worsening symptoms.

## 2023-07-13 NOTE — Consult Note (Signed)
GYN Consult Name: Tracy Morse MRN: [id number] Date of Service: 07/13/2023   CC: vaginal bleeding   Subjective:  HPI: Tracy Morse is a 27 y.o. G2P1011 s/p suction D&C for a [redacted]w[redacted]d MAB today who presents to the ED for heavy vaginal bleeding.   Reports had minimal spotting this morning and early afternoon. Then around 3pm she stood up and had a big gush of blood that filled the toilet. She then changed soaked pads every 30-60 minutes. Reports her friend who is a nurse took her vitals and they were normal.   Reports some lightheadedness and mild abdominal cramping. Denies fevers, chills, abnormal vaginal discharge.   In the ED, vitals wnl. Hb 14.1. Pelvic ultrasound with 1.9cm EMS, no IUP/ GS.  Received IM methergine 0.2 mg    ROS: All other systems reviewed and negative    OB Hx:  OB History  Gravida Para Term Preterm AB Living  1 1 1     1   SAB IAB Ectopic Multiple Live Births        0 1    # Outcome Date GA Lbr Len/2nd Weight Sex Type Anes PTL Lv  1 Term 02/27/22 [redacted]w[redacted]d 07:19 / 04:40 3760 g M Vag-Spont EPI  LIV     PMH:  Past Medical History:  Diagnosis Date   Anemia    Anorexia    Anxiety    Migraine      PSH:  Past Surgical History:  Procedure Laterality Date   DILATION AND EVACUATION N/A 07/13/2023   Procedure: SUCTION D&C;  Surgeon: Romana Juniper, MD;  Location: [facility] ORS;  Service: Gynecology;  Laterality: N/A;     Meds:  Prior to Admission medications   Medication Sig Start Date End Date Taking? Authorizing Provider  Prenatal Vit-Fe Fumarate-FA (MULTIVITAMIN-PRENATAL) 27-0.8 MG TABS tablet Take 1 tablet by mouth daily at 12 noon.    [provider]     Allergies:  Allergies  Allergen Reactions   Cephalosporins Rash   Dicloxacillin Sodium Itching     FH:  Family History  Problem Relation Age of Onset   Colon polyps Mother    Hyperlipidemia Mother    Anxiety disorder Mother    Colon polyps Father    Alcohol abuse  Father    High blood pressure Father    Hyperlipidemia Father    Colon cancer Maternal Grandmother    Coronary artery disease Maternal Grandfather    Colon cancer Paternal Grandmother    Coronary artery disease Paternal Grandfather      SH:  Social History   Socioeconomic History   Marital status: Married    Spouse name: Not on file   Number of children: Not on file   Years of education: Not on file   Highest education level: Not on file  Occupational History   Not on file  Tobacco Use   Smoking status: Never   Smokeless tobacco: Never  Vaping Use   Vaping status: Never Used  Substance and Sexual Activity   Alcohol use: Never   Drug use: Never   Sexual activity: Yes    Birth control/protection: None  Other Topics Concern   Not on file  Social History Narrative   Not on file   Social Determinants of Health   Financial Resource Strain: Low Risk  (09/21/2022)   Received from [facility]   Overall Financial Resource Strain (CARDIA)    Difficulty of Paying Living Expenses: Not hard at all  Food Insecurity: No Food Insecurity (09/21/2022)   Received from [facility]   Hunger Vital Sign    Worried About Running Out of Food in the Last Year: Never true    Ran Out of Food in the Last Year: Never true  Transportation Needs: No Transportation Needs (09/21/2022)   Received from [facility] - Transportation    In the past 12 months, has lack of transportation kept you from medical appointments or from getting medications?: No    Lack of Transportation (Non-Medical): No  Physical Activity: Not on file  Stress: Not on file  Social Connections: Not on file  Intimate Partner Violence: Not on file   Objective:   Physical Exam: Vitals:   07/13/23 1727 07/13/23 1913  BP: 134/77 123/67  Pulse: 96 74  Resp: 20 16  Temp: 98.2 F (36.8 C) 98.2 F (36.8 C)  SpO2: 97% 98%   Body mass index is 31.28 kg/m.    General: Alert, NAD HEENT: Mucus membranes moist.  CV: Regular rate Resp: No increased work of breathing Abd: Soft, nontender to palpation, no rebound or guarding, unable to palpate fundus Pelvic: (chaperone- ultrasound tech present) Dried blood on perineum Normal external female genitalia without lesions;  Normal bartholins/skenes glands, normal urethral meatus;  Vaginal mucosa pink and moist with normal rugae, 2 fox swabs of dried dark blood in the vault, no clots Cervix is normal without lesions, left tenaculum site with previously applied silver nitrate visualized and hemostatic, no active bleeding from cervix Bimanual: declined Skin: No rashes.  Ext: No peripheral edema. Psych: Appropriate affect.    Labs: Recent Labs    07/13/23 0714 07/13/23 1743  WBC 7.4 12.1*  HGB 13.5 14.1  HCT 39.6 40.9  PLT 248 288     Radiology: TRANSVAGINAL OB US- 07/13/23   TECHNIQUE: Both transabdominal and transvaginal ultrasound examinations were performed for complete evaluation of the gestation as well as the maternal uterus, adnexal regions, and pelvic cul-de-sac. Transvaginal technique was performed to assess early pregnancy.   COMPARISON:  None Available.   FINDINGS: Intrauterine gestational sac: None.   Endometrium: Thickened measuring 19 mm. There is diffuse heterogeneity throughout the endometrium with some bright echoes, possibly related to air. There is no significant flow identified within the endometrium.   Uterus: Measures 10.3 x 6.1 x 6.3 cm (205 mL). No focal myometrial lesions.   Right ovary: Not visualized.   Left ovary: Appears normal measuring 2.5 x 1.7 x 2.5 cm (5.7 mL).   Other: There is trace free fluid in the pelvis.   IMPRESSION: 1. No intrauterine gestational sac identified. The endometrium is thickened and heterogeneous with bright echoes which may be related to air/recent procedure. No significant vascular flow identified. Endometritis or  retained products of conception can not be excluded given this clinical appearance.     Assessment/Plan: Tracy Morse is a 27 y.o. G2P1011 s/p suction D&C for a [redacted]w[redacted]d MAB today who presents to the ED for heavy vaginal bleeding. Bedside ultrasound performed at end of D&C showed a thin endometrial stripe and no evidence of perforation. Patient is stable and bleeding has slowed significantly with no active bleeding observed on exam.   #Vaginal bleeding #S/p suction D&C for MAB - Normal vitals - Hb stable from pre-op - Pelvic ultrasound with heterogenous 1.9cm EMS which could represent normal post D&C finding versus retained POC   - No active bleeding on my exam - S/p IM methergine 0.2  mg in ED. Rx PO methergine 0.2 mg q6h x 3 doses.  - Strict return precautions discussed    Electronically signed: Romana Juniper, MD, 07/13/2023 9:04 PM

## 2023-07-13 NOTE — ED Triage Notes (Signed)
Pt reports that she had a D+C earlier today for miscarriage and has been having heavy bleeding since. Changing pads q1h. Pt denies SOB. Endorses dizziness.

## 2023-07-14 NOTE — Anesthesia Postprocedure Evaluation (Signed)
Anesthesia Post Note  Patient: Tracy Morse  Procedure(s) Performed: SUCTION D&C  Patient location during evaluation: PACU Anesthesia Type: General Level of consciousness: awake and alert Pain management: pain level controlled Vital Signs Assessment: post-procedure vital signs reviewed and stable Respiratory status: spontaneous breathing, nonlabored ventilation, respiratory function stable and patient connected to nasal cannula oxygen Cardiovascular status: blood pressure returned to baseline and stable Postop Assessment: no apparent nausea or vomiting Anesthetic complications: no   No notable events documented.   Last Vitals:  Vitals:   07/13/23 1100 07/13/23 1113  BP: 109/64 118/74  Pulse: 79 77  Resp: 16 16  Temp: 36.9 C 37 C  SpO2: 100% 100%    Last Pain:  Vitals:   07/14/23 1002  TempSrc:   PainSc: 3                  Stephanie Coup

## 2023-07-15 LAB — SURGICAL PATHOLOGY

## 2023-10-20 NOTE — L&D Delivery Note (Signed)
 Delivery Note  Date of delivery: 06/04/2024 Estimated Date of Delivery: 06/11/24 No LMP recorded. Patient is pregnant. EGA: [redacted]w[redacted]d  Delivery Note At 6:45 PM a viable female was delivered via Vaginal, Spontaneous (Presentation: Right Occiput Anterior).  APGAR: 7, 9; weight pending.  Placenta status: Spontaneous, Intact.  Cord: 3 vessels with the following complications: None.   First Stage: Labor onset: unknown Induction : Cytotec , Pitocin  Analgesia Tracy intrapartum: Epidural AROM at [name] presented to L&D with elective IOL. She was induced with cytotec  and pitocin . Epidural placed for pain relief.   Second Stage: Complete dilation at 1840 Onset of pushing at 1841 FHR second stage Cat III with Dr. JONETTA. Schermerhorn heading in  Delivery at 1845 on 06/04/2024  She progressed to complete and had a spontaneous vaginal birth of a live female over an intact perineum. The fetal head was delivered in OA position with restitution to ROA. No nuchal cord. Anterior then posterior shoulders delivered spontaneously with minimal assistance. Baby placed on mom's abdomen and attended to by transition RN. Cord clamped and cut after 1+ min by FOB.   Third Stage: Placenta delivered intact with 3VC at 1850 Placenta disposition: routine disposal  Uterine tone firm / bleeding min IV pitocin  given for hemorrhage prophylaxis  Anesthesia: Epidural Episiotomy: None Lacerations: None Suture Repair: n/a Est. Blood Loss (mL): 100  Complications: none  Mom to postpartum.  Baby to Couplet care / Skin to Skin.  Newborn: Birth Weight: pending  Apgar Scores: 7, 9 Feeding planned: breastfeeding   Tracy Morse, CNM 06/04/2024 7:21 PM

## 2023-11-05 DIAGNOSIS — O0992 Supervision of high risk pregnancy, unspecified, second trimester: Secondary | ICD-10-CM | POA: Insufficient documentation

## 2023-11-26 LAB — OB RESULTS CONSOLE VARICELLA ZOSTER ANTIBODY, IGG: Varicella: IMMUNE

## 2023-11-26 LAB — OB RESULTS CONSOLE HIV ANTIBODY (ROUTINE TESTING): HIV: NONREACTIVE

## 2023-11-26 LAB — HEPATITIS C ANTIBODY: HCV Ab: NEGATIVE

## 2023-11-26 LAB — OB RESULTS CONSOLE RUBELLA ANTIBODY, IGM: Rubella: IMMUNE

## 2023-11-26 LAB — OB RESULTS CONSOLE RPR: RPR: NONREACTIVE

## 2023-11-26 LAB — OB RESULTS CONSOLE HEPATITIS B SURFACE ANTIGEN: Hepatitis B Surface Ag: NEGATIVE

## 2024-03-22 LAB — OB RESULTS CONSOLE RPR: RPR: NONREACTIVE

## 2024-03-22 LAB — OB RESULTS CONSOLE HIV ANTIBODY (ROUTINE TESTING): HIV: NONREACTIVE

## 2024-03-26 ENCOUNTER — Encounter: Payer: Self-pay | Admitting: Obstetrics and Gynecology

## 2024-04-05 ENCOUNTER — Observation Stay
Admission: EM | Admit: 2024-04-05 | Discharge: 2024-04-05 | Disposition: A | Discharge status: Home / Self Care | Attending: Obstetrics and Gynecology | Admitting: Obstetrics and Gynecology

## 2024-04-05 ENCOUNTER — Other Ambulatory Visit: Payer: Self-pay

## 2024-04-05 DIAGNOSIS — Z6831 Body mass index (BMI) 31.0-31.9, adult: Secondary | ICD-10-CM | POA: Insufficient documentation

## 2024-04-05 DIAGNOSIS — O99213 Obesity complicating pregnancy, third trimester: Secondary | ICD-10-CM | POA: Diagnosis not present

## 2024-04-05 DIAGNOSIS — O4290 Premature rupture of membranes, unspecified as to length of time between rupture and onset of labor, unspecified weeks of gestation: Principal | ICD-10-CM | POA: Diagnosis present

## 2024-04-05 DIAGNOSIS — O4193X0 Disorder of amniotic fluid and membranes, unspecified, third trimester, not applicable or unspecified: Principal | ICD-10-CM | POA: Insufficient documentation

## 2024-04-05 DIAGNOSIS — Z3A30 30 weeks gestation of pregnancy: Secondary | ICD-10-CM | POA: Insufficient documentation

## 2024-04-05 DIAGNOSIS — E669 Obesity, unspecified: Secondary | ICD-10-CM | POA: Diagnosis not present

## 2024-04-05 LAB — WET PREP, GENITAL
Clue Cells Wet Prep HPF POC: NONE SEEN
Sperm: NONE SEEN
Trich, Wet Prep: NONE SEEN
WBC, Wet Prep HPF POC: <10 (ref <10)
Yeast Wet Prep HPF POC: NONE SEEN

## 2024-04-05 LAB — RUPTURE OF MEMBRANE (ROM)PLUS: Rom Plus: NEGATIVE

## 2024-04-05 MED ORDER — LACTATED RINGERS IV BOLUS: 1000.0000 mL | Freq: Once | INTRAVENOUS | Status: DC

## 2024-04-05 NOTE — Progress Notes (Signed)
 Discharge instructions provided to pt. Pt verbalizes understanding. Vaginal bleeding and discharge, contractions, and fetal movement reviewed by RN. Follow-up care reviewed. Pt discharged home by self.

## 2024-04-05 NOTE — Discharge Summary (Signed)
 Patient ID: Tracy Morse MRN: [id number] DOB/AGE: 21-Apr-1996 27 y.o.  Admit date: 04/05/2024 Discharge date: 04/05/2024  Admission Diagnoses: Leaking fluid  Discharge Diagnoses: ROM+ and wet prep neg, No active labor  Factors complicating pregnancy: Obesity BMI: 31 Pregnancy achieved with Femara Remote hx Cold sores Hx of migraines H/o mental health diagnoses: Anxiety Acne Vulgaris History of Anemia   Prenatal Procedures: NST  Consults: None  Significant Diagnostic Studies:  Results for orders placed or performed during the hospital encounter of 04/05/24 (from the past week)  Wet prep, genital   Collection Time: 04/05/24  1:43 PM   Specimen: Vaginal  Result Value Ref Range   Yeast Wet Prep HPF POC NONE SEEN NONE SEEN   Trich, Wet Prep NONE SEEN NONE SEEN   Clue Cells Wet Prep HPF POC NONE SEEN NONE SEEN   WBC, Wet Prep HPF POC <10 <10   Sperm NONE SEEN   Rupture of Membrane (ROM) Plus   Collection Time: 04/05/24  1:44 PM  Result Value Ref Range   Rom Plus NEGATIVE     Treatments: IV hydration - pt declined  Hospital Course:  This is a 28 y.o. G3P1011 with IUP at [redacted]w[redacted]d seen for possible leaking fluid.  Labs were negative - noted above, and pt requested going home to PO hydrate.  She was observed, fetal heart rate monitoring remained reassuring, and she had no signs/symptoms of preterm labor or other maternal-fetal concerns.  She was deemed stable for discharge to home with outpatient follow up.  Discharge Physical Exam:  BP 135/66 (BP Location: Right Arm)   Pulse 95   Temp 98.3 F (36.8 C) (Oral)   Resp 18   Ht 5' 2 (1.575 m)   Wt 86.2 kg   BMI 34.75 kg/m   NST: FHR baseline: 130 bpm Variability: moderate Accelerations: yes Decelerations: none Category/reactivity: reactive  TOCO: quiet SVE: deferred      Discharge Condition: Stable  Disposition: Discharge disposition: 01-Home or Self Care        Allergies as of 04/05/2024        Reactions   Cephalosporins Rash   Dicloxacillin Sodium Itching        Medication List     TAKE these medications    ferrous sulfate 325 (65 FE) MG EC tablet Take 325 mg by mouth 3 (three) times daily with meals.   multivitamin-prenatal 27-0.8 MG Tabs tablet Take 1 tablet by mouth daily at 12 noon.        Follow-up Information     [facility] Follow up.   Why: Keep all scheduled appointments Contact information: [address] [place]  [address] [phone]                Signed:  Collin Deal, CNM 04/05/2024 2:40 PM

## 2024-04-05 NOTE — OB Triage Note (Addendum)
 Pt is a 27yo G3P1, 30w 3d. She arrived to the unit with complaints of leaking of fluid. She states that she has had 2 gushes today of watery discharge, at 0930 and 1100. The fluid has not continuously leaked and pt is dry and not wearing a pad at this time. She states that she is feeling some abdominal cramping that she would rate 3/10 on pain scale. She thought that she had food poisoning yesterday and called the on call line.  Endorses positive FM and denies vaginal bleeding. Initial FHT 135 at 1303 CNM aware of pt arrival

## 2024-04-17 ENCOUNTER — Other Ambulatory Visit: Payer: Self-pay | Admitting: *Deleted

## 2024-04-17 ENCOUNTER — Other Ambulatory Visit: Payer: Self-pay | Admitting: Certified Nurse Midwife

## 2024-04-17 DIAGNOSIS — D509 Iron deficiency anemia, unspecified: Secondary | ICD-10-CM

## 2024-04-17 NOTE — Progress Notes (Signed)
 28 year old G3P1011 at [redacted]w[redacted]d with iron  deficiency anemia, Hgb 11.3, ferritin 7. Has tried oral supplementation for several months without improvement. Had to receive iron  infusions in her last pregnancy. Venofer  300mg  IV weekly x3  Edsel Charlies Blush, CNM 04/17/2024 9:07 AM

## 2024-04-19 ENCOUNTER — Ambulatory Visit
Admission: RE | Admit: 2024-04-19 | Discharge: 2024-04-19 | Disposition: A | Discharge status: Home / Self Care | Source: Ambulatory Visit | Attending: Certified Nurse Midwife | Admitting: Certified Nurse Midwife

## 2024-04-19 DIAGNOSIS — Z01818 Encounter for other preprocedural examination: Secondary | ICD-10-CM | POA: Diagnosis present

## 2024-04-19 DIAGNOSIS — D509 Iron deficiency anemia, unspecified: Secondary | ICD-10-CM | POA: Diagnosis not present

## 2024-04-19 DIAGNOSIS — Z3A32 32 weeks gestation of pregnancy: Secondary | ICD-10-CM | POA: Diagnosis not present

## 2024-04-19 DIAGNOSIS — O99013 Anemia complicating pregnancy, third trimester: Secondary | ICD-10-CM | POA: Diagnosis not present

## 2024-04-19 MED ORDER — IRON SUCROSE 300 MG IVPB - SIMPLE MED
300.0000 mg | Status: DC
  Administered 2024-04-19: 300 mg via INTRAVENOUS
  Filled 2024-04-19: qty 300

## 2024-04-27 ENCOUNTER — Ambulatory Visit
Admission: RE | Admit: 2024-04-27 | Discharge: 2024-04-27 | Disposition: A | Discharge status: Home / Self Care | Source: Ambulatory Visit | Attending: Certified Nurse Midwife | Admitting: Certified Nurse Midwife

## 2024-04-27 DIAGNOSIS — O99013 Anemia complicating pregnancy, third trimester: Secondary | ICD-10-CM | POA: Insufficient documentation

## 2024-04-27 DIAGNOSIS — Z3A33 33 weeks gestation of pregnancy: Secondary | ICD-10-CM | POA: Diagnosis not present

## 2024-04-27 MED ORDER — IRON SUCROSE 300 MG IVPB - SIMPLE MED
300.0000 mg | Freq: Once | Status: AC
  Administered 2024-04-27: 300 mg via INTRAVENOUS
  Filled 2024-04-27: qty 300

## 2024-05-04 ENCOUNTER — Ambulatory Visit
Admission: RE | Admit: 2024-05-04 | Discharge: 2024-05-04 | Disposition: A | Discharge status: Home / Self Care | Source: Ambulatory Visit | Attending: Certified Nurse Midwife | Admitting: Certified Nurse Midwife

## 2024-05-04 DIAGNOSIS — Z3A33 33 weeks gestation of pregnancy: Secondary | ICD-10-CM | POA: Diagnosis not present

## 2024-05-04 DIAGNOSIS — O99013 Anemia complicating pregnancy, third trimester: Secondary | ICD-10-CM | POA: Insufficient documentation

## 2024-05-04 DIAGNOSIS — D509 Iron deficiency anemia, unspecified: Secondary | ICD-10-CM | POA: Diagnosis present

## 2024-05-04 MED ORDER — IRON SUCROSE 300 MG IVPB - SIMPLE MED
300.0000 mg | Freq: Once | Status: AC
  Administered 2024-05-04: 300 mg via INTRAVENOUS
  Filled 2024-05-04: qty 300

## 2024-05-05 ENCOUNTER — Emergency Department

## 2024-05-05 ENCOUNTER — Emergency Department
Admission: EM | Admit: 2024-05-05 | Discharge: 2024-05-05 | Disposition: A | Discharge status: Home / Self Care | Source: Ambulatory Visit | Attending: Emergency Medicine | Admitting: Emergency Medicine

## 2024-05-05 ENCOUNTER — Other Ambulatory Visit: Payer: Self-pay

## 2024-05-05 ENCOUNTER — Encounter: Payer: Self-pay | Admitting: Emergency Medicine

## 2024-05-05 DIAGNOSIS — I8000 Phlebitis and thrombophlebitis of superficial vessels of unspecified lower extremity: Secondary | ICD-10-CM | POA: Diagnosis not present

## 2024-05-05 DIAGNOSIS — O2223 Superficial thrombophlebitis in pregnancy, third trimester: Secondary | ICD-10-CM | POA: Insufficient documentation

## 2024-05-05 NOTE — ED Triage Notes (Signed)
 Patient to ED via POV for rash on left arm. Bruising on left forearm from IV infusion yesterday- iron . PT reports that her IV supplies were placed on a trash can with trash in can before starting line. Red rash above insertion site. PT reports pain to area. Currently 34w 5d pregnant.

## 2024-05-05 NOTE — ED Provider Notes (Signed)
 [facility] Provider Note    Event Date/Time   First MD Initiated Contact with Patient 05/05/24 1102     (approximate)   History   Rash   HPI  Tracy Morse is a 28 y.o. female who presents to the ED for evaluation of Rash   G3 at 34 weeks with history of iron  deficiency anemia.  I reviewed routine prenatal clinic visit from 10 days ago.  Patient presents to the ED after receiving routine IV iron  infusion yesterday, developing a rash to her left forearm around the IV site.  Area of bruising near the IV site, faint redness streaking proximal to this.  No systemic symptoms and no other concerns   Physical Exam   Triage Vital Signs: ED Triage Vitals  Encounter Vitals Group     BP 05/05/24 1034 136/86     Girls Systolic BP Percentile --      Girls Diastolic BP Percentile --      Boys Systolic BP Percentile --      Boys Diastolic BP Percentile --      Pulse Rate 05/05/24 1034 100     Resp 05/05/24 1034 17     Temp 05/05/24 1034 99.1 F (37.3 C)     Temp Source 05/05/24 1034 Oral     SpO2 05/05/24 1034 100 %     Weight 05/05/24 1035 198 lb (89.8 kg)     Height 05/05/24 1035 5' 2 (1.575 m)     Head Circumference --      Peak Flow --      Pain Score 05/05/24 1035 6     Pain Loc --      Pain Education --      Exclude from Growth Chart --     Most recent vital signs: Vitals:   05/05/24 1034  BP: 136/86  Pulse: 100  Resp: 17  Temp: 99.1 F (37.3 C)  SpO2: 100%    General: Awake, no distress.  Gravid, well-appearing, sitting upright in a bedside chair, pleasant and conversational CV:  Good peripheral perfusion.  Resp:  Normal effort.  Abd:  No distention.  MSK:  No deformity noted.  Neuro:  No focal deficits appreciated. Other:  As pictured below, to the volar left forearm, as the area of concern.  Venipuncture site with small area of bruising surrounding this, fairly circumferentially.  Approximately is an area of flat redness  with minimal tenderness to palpation.  No hives.  No diffuse swelling of the arm when compared to the right.  Hand, distal to this, is neurovascularly intact     ED Results / Procedures / Treatments   Labs (all labs ordered are listed, but only abnormal results are displayed) Labs Reviewed - No data to display  EKG   RADIOLOGY Ultrasound of the left upper extremity interpreted by me without signs of DVT, radiographically consistent with superficial thrombophlebitis  Official radiology report(s): US  Venous Img Upper Uni Left Result Date: 05/05/2024 CLINICAL DATA:  Superficial thrombophlebitis on examination. Recent IV stick. Evaluate for DVT. EXAM: LEFT UPPER EXTREMITY VENOUS DOPPLER ULTRASOUND TECHNIQUE: Gray-scale sonography with graded compression, as well as color Doppler and duplex ultrasound were performed to evaluate the upper extremity deep venous system from the level of the subclavian vein and including the jugular, axillary, basilic, radial, ulnar and upper cephalic vein. Spectral Doppler was utilized to evaluate flow at rest and with distal augmentation maneuvers. COMPARISON:  None available FINDINGS: Contralateral Subclavian Vein: Respiratory  phasicity is normal and symmetric with the symptomatic side. No evidence of thrombus. Normal compressibility. Internal Jugular Vein: No evidence of thrombus. Normal compressibility, respiratory phasicity and response to augmentation. Subclavian Vein: No evidence of thrombus. Normal compressibility, respiratory phasicity and response to augmentation. Axillary Vein: No evidence of thrombus. Normal compressibility, respiratory phasicity and response to augmentation. Cephalic Vein: No evidence of thrombus. Normal compressibility, respiratory phasicity and response to augmentation. Basilic Vein: 1 branch of the basilic vein within the forearm is noncompressible, consistent with superficial thrombophlebitis. Brachial Veins: No evidence of thrombus.  Normal compressibility, respiratory phasicity and response to augmentation. Radial Veins: No evidence of thrombus. Normal compressibility, respiratory phasicity and response to augmentation. Ulnar Veins: No evidence of thrombus. Normal compressibility, respiratory phasicity and response to augmentation. Other Findings:  None visualized. IMPRESSION: 1. No evidence of DVT within the left upper extremity. 2. Superficial thrombophlebitis of a branch of the basilic vein in the LEFT forearm. Electronically Signed   By: Aliene Lloyd M.D.   On: 05/05/2024 13:05    PROCEDURES and INTERVENTIONS:  Procedures  Medications - No data to display   IMPRESSION / MDM / ASSESSMENT AND PLAN / ED COURSE  I reviewed the triage vital signs and the nursing notes.  Differential diagnosis includes, but is not limited to, superficial thrombophlebitis, cellulitis, DVT  {Patient presents with symptoms of an acute illness or injury that is potentially life-threatening.  Generally healthy but third-trimester pregnant patient presents with most likely an area of superficial thrombophlebitis.  Looks well without systemic symptoms.  Rash, pictured above, seems most consistent with thrombophlebitis traumatically from IV site yesterday.  We will obtain ultrasound due to her underlying hypercoagulable status in setting of pregnancy.  She has explicit concerns for cellulitis (she is an [employer] fundraiser) and I get the impression she would like a few days of antibiotics to cover for this.  Clinical Course as of 05/05/24 1316  Fri May 05, 2024  1315 Reassessed and discussed ultrasound results.  We discussed risks and benefits of antibiotics and she is agreeable without systemic antibiotics which I think is most reasonable.  We discussed expectant management, wound care and natural progression of superficial thrombophlebitis.  She has routine follow-up with OB this coming Tuesday we discussed appropriate ED return precautions [DS]    Clinical  Course User Index [DS] Claudene Rover, MD     FINAL CLINICAL IMPRESSION(S) / ED DIAGNOSES   Final diagnoses:  Superficial thrombophlebitis during pregnancy in third trimester     Rx / DC Orders   ED Discharge Orders     None        Note:  This document was prepared using Dragon voice recognition software and may include unintentional dictation errors.   Claudene Rover, MD 05/05/24 [phone]

## 2024-05-05 NOTE — Discharge Instructions (Addendum)
 Ice   Tylenol   Compression  Local wound care  Return to the ED with any worsening symptoms or other concerns

## 2024-05-15 ENCOUNTER — Observation Stay
Admission: EM | Admit: 2024-05-15 | Discharge: 2024-05-15 | Disposition: A | Discharge status: Home / Self Care | Attending: Obstetrics | Admitting: Obstetrics

## 2024-05-15 ENCOUNTER — Observation Stay

## 2024-05-15 DIAGNOSIS — O368130 Decreased fetal movements, third trimester, not applicable or unspecified: Secondary | ICD-10-CM | POA: Diagnosis present

## 2024-05-15 DIAGNOSIS — Z3A36 36 weeks gestation of pregnancy: Secondary | ICD-10-CM | POA: Insufficient documentation

## 2024-05-15 DIAGNOSIS — O368190 Decreased fetal movements, unspecified trimester, not applicable or unspecified: Principal | ICD-10-CM | POA: Diagnosis present

## 2024-05-15 DIAGNOSIS — Z8616 Personal history of COVID-19: Secondary | ICD-10-CM | POA: Insufficient documentation

## 2024-05-15 NOTE — OB Triage Note (Signed)
 Pt discharged home per order. Pt feeling frequent fetal movement.  Pt stable and ambulatory and an After Visit Summary was printed and given to the patient. Discharge education completed with patient including follow up instructions, appointments, and medication list. Pt received labor and bleeding precautions. Patient able to verbalize understanding, all questions fully answered upon discharge.  Pt scheduled for appt in office tomorrow.

## 2024-05-15 NOTE — Discharge Summary (Signed)
 Tracy Morse is a 28 y.o. female. She is at [redacted]w[redacted]d gestation. No LMP recorded. Patient is pregnant. Estimated Date of Delivery: 06/11/24  Prenatal care site: [facility]  Chief complaint: decreased fetal movement   HPI: Tracy Morse presents to L&D with complaints of decreased fetal movement today. She reports eating, drinking and eating ice cream and still did not feel baby move until she got to the hospital.  Factors complicating pregnancy: Patient Active Problem List   Diagnosis Date Noted   Decreased fetal movement 05/15/2024   Amniotic fluid leaking 04/05/2024   NSVD (normal spontaneous vaginal delivery) 03/01/2022   Uterine size date discrepancy pregnancy 02/26/2022   History of 2019 novel coronavirus disease (COVID-19) 02/12/2022   Positive GBS test 02/12/2022   Uterine size date discrepancy, second trimester 12/25/2021   Anemia during pregnancy in third trimester 12/12/2021   Palpitations 10/09/2021   Encounter for supervision of other normal pregnancy, third trimester 08/04/2021   Generalized anxiety disorder 12/02/2017   Immune to varicella 06/30/2016   Anxiety 03/26/2016     S: Resting comfortably. no CTX, no VB.no LOF,  Active fetal movement.   Maternal Medical History:  Past Medical Hx:  has a past medical history of Anemia, Anorexia, Anxiety, and Migraine.    Past Surgical Hx:  has a past surgical history that includes Dilation and evacuation (N/A, 07/13/2023).   Allergies  Allergen Reactions   Cephalosporins Rash   Dicloxacillin Sodium Itching     Prior to Admission medications   Medication Sig Start Date End Date Taking? Authorizing Provider  ferrous sulfate 325 (65 FE) MG EC tablet Take 325 mg by mouth 3 (three) times daily with meals.   Yes [provider]  Prenatal Vit-Fe Fumarate-FA (MULTIVITAMIN-PRENATAL) 27-0.8 MG TABS tablet Take 1 tablet by mouth daily at 12 noon.    [provider]    Social History: She  reports  that she has never smoked. She has never used smokeless tobacco. She reports that she does not drink alcohol and does not use drugs.  Family History: family history includes Alcohol abuse in her father; Anxiety disorder in her mother; Colon cancer in her maternal grandmother and paternal grandmother; Colon polyps in her father and mother; Coronary artery disease in her maternal grandfather and paternal grandfather; High blood pressure in her father; Hyperlipidemia in her father and mother. ,no history of gyn cancers  Review of Systems: A full review of systems was performed and negative except as noted in the HPI.    O:  BP 134/65   Pulse (!) 102   Temp 99.1 F (37.3 C) (Oral)   Resp 16  No results found for this or any previous visit (from the past 48 hours).   Constitutional: NAD, AAOx3  HE/ENT: extraocular movements grossly intact, moist mucous membranes CV: RRR PULM: nl respiratory effort, CTABL Abd: gravid, non-tender, non-distended, soft  Ext: Non-tender, Nonedmeatous Psych: mood appropriate, speech normal Pelvic : deferred SVE:     NST: Baseline FHR: 130 beats/min Variability: moderate Accelerations: present Decelerations: absent Tocometry: occasional  Time: at least 20 minutes   Interpretation: Category I INDICATIONS: decreased fetal movement RESULTS:  A NST procedure was performed with FHR monitoring and a normal baseline established, appropriate time of 20-40 minutes of evaluation, and accels >2 seen w 15x15 characteristics.  Results show a REACTIVE NST.    Imaging Studies: US  Venous Img Upper Uni Left Result Date: 05/05/2024 CLINICAL DATA:  Superficial thrombophlebitis on examination. Recent IV stick. Evaluate  for DVT. EXAM: LEFT UPPER EXTREMITY VENOUS DOPPLER ULTRASOUND TECHNIQUE: Gray-scale sonography with graded compression, as well as color Doppler and duplex ultrasound were performed to evaluate the upper extremity deep venous system from the level of the  subclavian vein and including the jugular, axillary, basilic, radial, ulnar and upper cephalic vein. Spectral Doppler was utilized to evaluate flow at rest and with distal augmentation maneuvers. COMPARISON:  None available FINDINGS: Contralateral Subclavian Vein: Respiratory phasicity is normal and symmetric with the symptomatic side. No evidence of thrombus. Normal compressibility. Internal Jugular Vein: No evidence of thrombus. Normal compressibility, respiratory phasicity and response to augmentation. Subclavian Vein: No evidence of thrombus. Normal compressibility, respiratory phasicity and response to augmentation. Axillary Vein: No evidence of thrombus. Normal compressibility, respiratory phasicity and response to augmentation. Cephalic Vein: No evidence of thrombus. Normal compressibility, respiratory phasicity and response to augmentation. Basilic Vein: 1 branch of the basilic vein within the forearm is noncompressible, consistent with superficial thrombophlebitis. Brachial Veins: No evidence of thrombus. Normal compressibility, respiratory phasicity and response to augmentation. Radial Veins: No evidence of thrombus. Normal compressibility, respiratory phasicity and response to augmentation. Ulnar Veins: No evidence of thrombus. Normal compressibility, respiratory phasicity and response to augmentation. Other Findings:  None visualized. IMPRESSION: 1. No evidence of DVT within the left upper extremity. 2. Superficial thrombophlebitis of a branch of the basilic vein in the LEFT forearm. Electronically Signed   By: Aliene Lloyd M.D.   On: 05/05/2024 13:05    Assessment: 28 y.o. [redacted]w[redacted]d here for antenatal surveillance during pregnancy. Patient was observed in OB Triage with FHT with a period of marked variability a BPP was ordered and fetus scored 8/8.she was placed on monitor for additional 20 mins and was reactive. Her baby is noted to be breech  Principle diagnosis: decreased fetal movement There were  no encounter diagnoses.   Plan: Labor: not present.  Fetal Wellbeing: Reassuring Cat 1 tracing. Reactive NST  BPP 8/8 D/c home stable, precautions reviewed, follow-up as scheduled.  Dr Lovetta updated and agrees with plan ----- Bobbette Brunswick, CNM Certified Nurse Midwife [facility]

## 2024-05-15 NOTE — OB Triage Note (Signed)
 Presents with concern of decreased fetal movement. States she has felt some but not as much as usual. Denies any bleeding, contractions or LOF. States she has eaten and been drinking fluids today and even has some ice cream. No history of any health concerns. EFM applied.

## 2024-05-16 LAB — OB RESULTS CONSOLE GC/CHLAMYDIA
Chlamydia: NEGATIVE
Neisseria Gonorrhea: NEGATIVE

## 2024-05-16 LAB — OB RESULTS CONSOLE GBS: GBS: NEGATIVE

## 2024-05-16 NOTE — Progress Notes (Signed)
 Obstetrics & Gynecology Office Visit  Subjective  Tracy Morse is a 28 y.o. G3P1011 at [redacted]w[redacted]d being seen today for ongoing prenatal care.  She is currently monitored for this high-risk pregnancy.  Patient's last menstrual period was 08/27/2023 (exact date). Estimated Date of Delivery: 06/11/24  Patient concerns today: No concerns.   Pt denies contractions, vaginal bleeding, leaking fluid. Endorses good fetal movement Pt denies HA, VD or RUQ pain.   Objective  BP 120/73   Pulse 87   Ht 157.5 cm (5' 2)   Wt 91.4 kg (201 lb 6.4 oz)   LMP 08/27/2023 (Exact Date)   BMI 36.84 kg/m    Pre-pregnant weight: 77.1 kg (170 lb)  TWG: 14.2 kg (31 lb 6.4 oz)  PE Chaperone note: A chaperone was included for sensitive portions of the exam- staff member name: - Alan Brasil, CMA  Chaperone present for pelvic exam.  Gen: NAD  Pulm: No use of accessory muscles, normal respirations Abdomen: Gravid, nontender Ext : Mild edema, no rashes.   Psych: Mood, insight, judgement intact SVE: Deferred  FHT by doppler: 135 bpm distinguished from mom Fundal height: 36 cm   Assessment   27 y.o. G3P1011 at [redacted]w[redacted]d by  06/11/2024, by Ultrasound presenting for routine prenatal visit  Plan   Problem list reviewed and/or updated  1. Supervision of high risk pregnancy in third trimester (HHS-HCC) - 36 week labs (GBS swab, CT/NG and CBC) ordered to be collected today (05/16/2024).  - Presentation: Breech via Leopold's (05/16/2024) - Desires TJS to peform c/s; to schedule next rpv with TJS  2. Iron  deficiency anemia, unspecified iron  deficiency anemia type - Ferritin ordered to be collected today (05/16/2024).  - Currently taking iron  supplements (05/16/2024) - Hgb: 11.6 (04/12/2024), Ferritin: 7 (04/12/2024)  3. Obesity in pregnancy (HHS-HCC) - BMI: 36.84 - TWG: 14.2 kg (31 lb 6.4 oz)  4. Anxiety - Patient declines taking medication during pregnancy. Prefers to start Zoloft  postpartum.   5. 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 Valtrex for suppression not indicated.   - Patient expresses that she would like to be on suppressive treatment prior to delivery.   6. Breech -  Desires TJS to peform c/s; to schedule next rpv with TJS  Return in about 1 week (around 05/23/2024) for ROB.   Attestation Statement:   I personally performed the service, non-incident to. (WP)   JAZMINE SIMONE LIBOON, CNM

## 2024-05-23 NOTE — Progress Notes (Signed)
 37+2 weeks   Unstable lie . Good fetal movt Not start valtrex yet   O: 109/71  FH 37 cm   FHR 135 with transient audible decrease in rate    U/s verifies : vtx  NST :120 + accels ,no decels _ REActive  A; reassuring fetal monitoring  P: pt anxious I will get repeat NST in 3 days  Continue fetal kick counts Rtc 1 week pnv

## 2024-05-26 NOTE — Progress Notes (Signed)
 NST: Baseline FHR: 135 beats/min Variability: moderate Accelerations: present Decelerations: absent Tocometry: N/A Time: at least 20 minutes   Interpretation: Category I INDICATIONS:  1. Supervision of high risk pregnancy in third trimester (HHS-HCC)   2. Obesity in pregnancy (HHS-HCC)     RESULTS:  A NST procedure was performed with FHR monitoring and a normal baseline established, appropriate time of 20-40 minutes of evaluation, and accels >2 seen w 15x15 characteristics.  Results show a REACTIVE NST.    Attestation Statement:   I personally performed the service, non-incident to. (WP)   ANNA ROSALINE PILLOW, CNM

## 2024-05-31 ENCOUNTER — Other Ambulatory Visit: Payer: Self-pay | Admitting: Obstetrics and Gynecology

## 2024-05-31 DIAGNOSIS — Z3490 Encounter for supervision of normal pregnancy, unspecified, unspecified trimester: Secondary | ICD-10-CM

## 2024-05-31 NOTE — Progress Notes (Signed)
 Prenatal visit  05/31/2024    Subjective   28 y.o. G3P1011 at [redacted]w[redacted]d who presents for a prenatal visit. Denies Ctx/VB/LOF. Lots of discharge. Good FM. Reports significant anxiety but does not want to start meds until she has delivered. Denies SI.     Objective   BP 130/77   Pulse 79   Ht 157.5 cm (5' 2)   Wt 92.5 kg (204 lb)   LMP 08/27/2023 (Exact Date)   BMI 37.31 kg/m   OB ultrasound- 05/31/24 Pregnancy =========   Singleton pregnancy. Number of fetuses: 1   Dating ======          Date    Details     Gest. age   EDD LMP    08/27/2023               39 w + 5 d  06/02/2024 Previous U/S   11/05/2023   GA, GA 8 w + 5 d        38 w + 3 d  06/11/2024 U/S    05/31/2024   based upon [facility], BPD, Femur, HC, Humerus  37 w + 4 d  06/17/2024 Assigned dating    based on ultrasound ([facility]), selected on 11/05/2023       38 w + 3 d  06/11/2024   Fetal Biometry ============   BPD        91.3    mm  37w 0d      Hadlock HC     329.0   mm  37w 2d      Hadlock AC     354.4   mm  39w 2d      Hadlock Femur      74.0    mm  37w 1d      Jeanty Humerus        65.1    mm  37w 5d      Jeanty Fetal Weight Calculation: EFW        3,511   g       67% Hadlock EFW (lb,oz)        7 lb 12 oz EFW by     Hadlock (BPD-HC-AC-FL)   General Evaluation ==============   Cardiac activity Present. FHR 131 bpm. Presentation: Cephalic Placenta: Placental site: Posterior. It has been documented in a previous report that there is no evidence of placenta previa. Umbilical cord: 3 vessel cord Amniotic fluid: Amount of AF: Normal. MVP 5.8 cm. AFI 20.9 cm   Fetal Anatomy ===========   Head / Neck    Cranium: Normal. Abdomen    Stomach: Present. Kidneys: Normal. Bladder: Present.   Biophysical Profile ==============   2: 2: Fetal breathing movements 2: 2: Gross body movements 2: 2: Fetal tone 2: 2: Amniotic fluid volume 8/8 Biophysical profile score   Impression =========   A single, viable intrauterine  pregnancy is seen with an estimated gestational age of 29w 3d. Dating assigned by LMP concordant with ... on 11/05/2023; measurements at that exam reported as 8w 5d.   The Estimated Fetal Weight is 3511 g with a percentile of 67%. The Abdominal Circumference percentile is at 88%. The amniotic fluid volume appears normal. Fetal anatomy appears normal or was previously documented as normal.   Incidental BPP 8/8   Assessment & Plan   28 y.o. G3P1011 at [redacted]w[redacted]d who presents for a prenatal visit.  Factors complicating this pregnancy  Obesity BMI: 31 Baseline labs: Early 1 hour GTT:78P/C ratio:131CMP:wnl  A1c:5.0  Growth u/s 05/31/24 wnl Pregnancy achieved with Femara Took progesterone until 12-16 weeks Remote hx Cold sores No genital sores ever, last oral HSV in college Desired suppressive tx prior to delivery Valtrex 500mg  BID at 36wks- taking Hx of migraines- Has had no had a migraines since 1st trimester. Anxiety Medications prior to pregnancy:Zoloft   Medications during pregnancy:None Counseling:  EPDS at [facility] - 1 Significant anxiety at 29 weeks. Does not want Zoloft  now but wants to start it postpartum.  Anemia Ferritin: 17 (03/22/24) 04/14/24: Hgb 11.4, ferritin 7 > s/p IV iron  infusions at the hospital x3 05/16/24: Ferritin:100   Hgb: 12.8 Unstable lie- cephalic on u/s most recently on 8/13 Plan to scan for presentation on admission to L&D If needs a c/s prefers for DVS or TJS to perform if possible /Screening results and needs: NOB:  Medicaid Questionnaire: N/a Depression Score: 1 MBT: A Positive   Ab screen:  Neg  HIV:Neg          RPR: NR   Hep B:Neg      Hep C:NR  Pap:03/29/23 ascus HPV Neg  G/C:Neg/neg  Rubella:Immune    VZV: Immune TSH: 1.905A1C: 5.0 Aneuploidy:  First trimester:  MaternitT21: declined 11/26/23 CF/SMA carrier screening:Declined 11/26/23 28 weeks:  Review Medicaid Questionnaire: []  ACHD Program Depression Score: 9 Blood consent: signed 03/22/24 Hgb:  11.8   Platelets: 228   Glucola:84    RPR: N/RHIV: neg 36 weeks:  GBS:neg   G/C: neg/neg  Hgb:12.8   Platelets:    182 Last US :  11/05/23: SIUP FHT=172 BPM. LMP not c/w GA of [redacted]w[redacted]d. New EDD is 06/11/24. Cervical length=6.02cm. Rt corpus luteum measuring 18 mm, lt ovary within normal limits. No FFseen. Note the retroflexed nature of uterus. While unlikely, consider monitoring for symptoms of uterine entrapment.  12/06/2023: No retroflexed uterus. Uterus normal orientation 01/11/2024: Anatomy: WNL; Presentation: breech; EFW: 237 grams; FHR: 150 bpm; Previa: low lying placenta - < 2cm from cervical OS posteriorly; Placenta: posterior - low lying; AFI: WNL; Adnexa: WNL; CL: 5.08 cm 03/08/24: AFI 17.6cm, Cephalic presentation, Posterior placenta 3.72 cm from internal os, low lying placenta has resolved 05/31/24: cephalic, EFW 3511g (67%), AC 11%, AF normal, BPP 8/8 Immunization:   Flu in season - declined 11/26/23 Tdap at 27-36 weeks - given [facility] 04/25/24 COVID- received 4 total, recommended 2024 booster RSV at 32-36 weeks -  Contraception Plan: none Feeding Plan: exclusive pumping Labor Plans: Support person: Husband, mom, brother  Pain management: Epidural  Peds: [facility]  Desires 39 week elective IOL    RTC for postpartum visit. Triage precautions discussed.      DEMETRA DICIE DINSMORE, MD, 05/31/2024, 4:09 PM

## 2024-05-31 NOTE — H&P (Signed)
 [id number] at [redacted]w[redacted]d, LMP of 08/27/23 inconsistent with early US  at 8 w5d.  Scheduled for elective induction of labor at [redacted]w[redacted]d on 06/04/24 at midnight.   Prenatal provider: [facility] Pregnancy complicated by: Obesity Anemia- resolved Unstable lie- plan to scan for presentation on admission Anxiety- wants to start Zoloft  in hospital postpartum Migraines Oral herpes Hx infertility  Prenatal Labs: Blood type/Rh A POS  Antibody screen Neg  Rubella Immune  Varicella Immune  RPR Neg  HBsAg Neg  Hep C Neg  HIV Neg  GC Neg  Chlamydia Neg  Genetic screening Declined  1 hour GTT 84  3 hour GTT N/a  GBS Neg   Flu: Declined Tdap: Received 04/25/24 RSV: Not season Contraception: None Feeding preference: Pumping  Beverli LULLA Dinsmore, MD 05/31/2024 7:35 PM

## 2024-06-04 ENCOUNTER — Encounter: Payer: Self-pay | Admitting: Obstetrics and Gynecology

## 2024-06-04 ENCOUNTER — Inpatient Hospital Stay

## 2024-06-04 ENCOUNTER — Inpatient Hospital Stay: Admitting: Anesthesiology

## 2024-06-04 ENCOUNTER — Inpatient Hospital Stay
Admission: EM | Admit: 2024-06-04 | Discharge: 2024-06-05 | DRG: 807 | Disposition: A | Discharge status: Home / Self Care | Attending: Obstetrics | Admitting: Obstetrics

## 2024-06-04 DIAGNOSIS — O9902 Anemia complicating childbirth: Secondary | ICD-10-CM | POA: Diagnosis present

## 2024-06-04 DIAGNOSIS — Z8249 Family history of ischemic heart disease and other diseases of the circulatory system: Secondary | ICD-10-CM | POA: Diagnosis not present

## 2024-06-04 DIAGNOSIS — O26893 Other specified pregnancy related conditions, third trimester: Secondary | ICD-10-CM | POA: Diagnosis present

## 2024-06-04 DIAGNOSIS — Z3490 Encounter for supervision of normal pregnancy, unspecified, unspecified trimester: Secondary | ICD-10-CM | POA: Diagnosis present

## 2024-06-04 DIAGNOSIS — O99214 Obesity complicating childbirth: Secondary | ICD-10-CM | POA: Diagnosis present

## 2024-06-04 DIAGNOSIS — Z3A39 39 weeks gestation of pregnancy: Secondary | ICD-10-CM

## 2024-06-04 LAB — CBC
HCT: 38.4 % (ref 36.0–46.0)
Hemoglobin: 13.2 g/dL (ref 12.0–15.0)
MCH: 29.2 pg (ref 26.0–34.0)
MCHC: 34.4 g/dL (ref 30.0–36.0)
MCV: 85 fL (ref 80.0–100.0)
Platelets: 190 K/uL (ref 150–400)
RBC: 4.52 MIL/uL (ref 3.87–5.11)
RDW: 15.6 % — ABNORMAL HIGH (ref 11.5–15.5)
WBC: 10.2 K/uL (ref 4.0–10.5)
nRBC: 0 % (ref 0.0–0.2)

## 2024-06-04 LAB — TYPE AND SCREEN
ABO/RH(D): A POS
Antibody Screen: NEGATIVE

## 2024-06-04 LAB — RPR: RPR Ser Ql: NONREACTIVE

## 2024-06-04 MED ORDER — FENTANYL CITRATE (PF) 100 MCG/2ML IJ SOLN
100.0000 ug | INTRAMUSCULAR | Status: DC | PRN
  Administered 2024-06-04: 100 ug via INTRAVENOUS

## 2024-06-04 MED ORDER — OXYTOCIN BOLUS FROM INFUSION
333.0000 mL | Freq: Once | INTRAVENOUS | Status: AC
  Administered 2024-06-04: 333 mL via INTRAVENOUS

## 2024-06-04 MED ORDER — WITCH HAZEL-GLYCERIN EX PADS
1.0000 | MEDICATED_PAD | CUTANEOUS | Status: DC | PRN
  Filled 2024-06-04: qty 100

## 2024-06-04 MED ORDER — DIPHENHYDRAMINE HCL 25 MG PO CAPS: 25.0000 mg | ORAL_CAPSULE | Freq: Four times a day (QID) | ORAL | Status: DC | PRN

## 2024-06-04 MED ORDER — ACETAMINOPHEN 325 MG PO TABS: 650.0000 mg | ORAL_TABLET | ORAL | Status: DC | PRN

## 2024-06-04 MED ORDER — SODIUM CHLORIDE 0.9 % IV SOLN
INTRAVENOUS | Status: AC
  Filled 2024-06-04: qty 5

## 2024-06-04 MED ORDER — FENTANYL CITRATE (PF) 100 MCG/2ML IJ SOLN
INTRAMUSCULAR | Status: AC
  Filled 2024-06-04: qty 2

## 2024-06-04 MED ORDER — MISOPROSTOL 25 MCG QUARTER TABLET
25.0000 ug | ORAL_TABLET | Freq: Once | ORAL | Status: AC
  Administered 2024-06-04: 25 ug via ORAL
  Filled 2024-06-04: qty 1

## 2024-06-04 MED ORDER — FENTANYL-BUPIVACAINE-NACL 0.5-0.125-0.9 MG/250ML-% EP SOLN
EPIDURAL | Status: DC | PRN
  Administered 2024-06-04: 12 mL/h via EPIDURAL

## 2024-06-04 MED ORDER — ONDANSETRON HCL 4 MG/2ML IJ SOLN: 4.0000 mg | Freq: Four times a day (QID) | INTRAMUSCULAR | Status: DC | PRN

## 2024-06-04 MED ORDER — DIBUCAINE (PERIANAL) 1 % EX OINT
1.0000 | TOPICAL_OINTMENT | CUTANEOUS | Status: DC | PRN
  Filled 2024-06-04: qty 28

## 2024-06-04 MED ORDER — SOD CITRATE-CITRIC ACID 500-334 MG/5ML PO SOLN: 30.0000 mL | ORAL | Status: DC | PRN

## 2024-06-04 MED ORDER — MISOPROSTOL 25 MCG QUARTER TABLET
25.0000 ug | ORAL_TABLET | Freq: Once | ORAL | Status: AC
  Administered 2024-06-04: 25 ug via VAGINAL
  Filled 2024-06-04: qty 1

## 2024-06-04 MED ORDER — COCONUT OIL OIL
1.0000 | TOPICAL_OIL | Status: DC | PRN
  Filled 2024-06-04: qty 7.5

## 2024-06-04 MED ORDER — CHLORHEXIDINE GLUCONATE 0.12 % MT SOLN
OROMUCOSAL | Status: AC
  Filled 2024-06-04: qty 15

## 2024-06-04 MED ORDER — OXYCODONE HCL 5 MG PO TABS: 10.0000 mg | ORAL_TABLET | ORAL | Status: DC | PRN

## 2024-06-04 MED ORDER — TETANUS-DIPHTH-ACELL PERTUSSIS 5-2.5-18.5 LF-MCG/0.5 IM SUSY: 0.5000 mL | PREFILLED_SYRINGE | Freq: Once | INTRAMUSCULAR | Status: DC

## 2024-06-04 MED ORDER — FENTANYL-BUPIVACAINE-NACL 0.5-0.125-0.9 MG/250ML-% EP SOLN
EPIDURAL | Status: AC
  Filled 2024-06-04: qty 250

## 2024-06-04 MED ORDER — BUPIVACAINE HCL (PF) 0.25 % IJ SOLN
INTRAMUSCULAR | Status: DC | PRN
  Administered 2024-06-04: 8 mL via EPIDURAL

## 2024-06-04 MED ORDER — FERROUS SULFATE 325 (65 FE) MG PO TABS
325.0000 mg | ORAL_TABLET | Freq: Two times a day (BID) | ORAL | Status: DC
  Administered 2024-06-05 (×2): 325 mg via ORAL
  Filled 2024-06-04 (×2): qty 1

## 2024-06-04 MED ORDER — CEFAZOLIN SODIUM-DEXTROSE 2-4 GM/100ML-% IV SOLN
INTRAVENOUS | Status: AC
  Filled 2024-06-04: qty 100

## 2024-06-04 MED ORDER — SOD CITRATE-CITRIC ACID 500-334 MG/5ML PO SOLN
ORAL | Status: AC
  Filled 2024-06-04: qty 15

## 2024-06-04 MED ORDER — LIDOCAINE-EPINEPHRINE (PF) 1.5 %-1:200000 IJ SOLN
INTRAMUSCULAR | Status: DC | PRN
  Administered 2024-06-04: 3 mL via EPIDURAL

## 2024-06-04 MED ORDER — SIMETHICONE 80 MG PO CHEW: 80.0000 mg | CHEWABLE_TABLET | ORAL | Status: DC | PRN

## 2024-06-04 MED ORDER — MISOPROSTOL 25 MCG QUARTER TABLET
25.0000 ug | ORAL_TABLET | ORAL | Status: DC
Start: 2024-06-04 — End: 2024-06-04

## 2024-06-04 MED ORDER — ACETAMINOPHEN 325 MG PO TABS
650.0000 mg | ORAL_TABLET | ORAL | Status: DC | PRN
  Administered 2024-06-04 – 2024-06-05 (×3): 650 mg via ORAL
  Filled 2024-06-04 (×3): qty 2

## 2024-06-04 MED ORDER — BENZOCAINE-MENTHOL 20-0.5 % EX AERO
1.0000 | INHALATION_SPRAY | CUTANEOUS | Status: DC | PRN
  Filled 2024-06-04: qty 56

## 2024-06-04 MED ORDER — OXYCODONE HCL 5 MG PO TABS
5.0000 mg | ORAL_TABLET | ORAL | Status: DC | PRN
  Administered 2024-06-05: 5 mg via ORAL
  Filled 2024-06-04: qty 1

## 2024-06-04 MED ORDER — OXYTOCIN-SODIUM CHLORIDE 30-0.9 UT/500ML-% IV SOLN: 2.5000 [IU]/h | INTRAVENOUS | Status: DC

## 2024-06-04 MED ORDER — SENNOSIDES-DOCUSATE SODIUM 8.6-50 MG PO TABS
2.0000 | ORAL_TABLET | Freq: Every day | ORAL | Status: DC
  Administered 2024-06-05: 2 via ORAL
  Filled 2024-06-04: qty 2

## 2024-06-04 MED ORDER — ONDANSETRON HCL 4 MG PO TABS: 4.0000 mg | ORAL_TABLET | ORAL | Status: DC | PRN

## 2024-06-04 MED ORDER — IBUPROFEN 600 MG PO TABS
600.0000 mg | ORAL_TABLET | Freq: Four times a day (QID) | ORAL | Status: DC
  Administered 2024-06-04 – 2024-06-05 (×4): 600 mg via ORAL
  Filled 2024-06-04 (×5): qty 1

## 2024-06-04 MED ORDER — MISOPROSTOL 25 MCG QUARTER TABLET: 25.0000 ug | ORAL_TABLET | ORAL | Status: DC

## 2024-06-04 MED ORDER — PRENATAL MULTIVITAMIN CH
1.0000 | ORAL_TABLET | Freq: Every day | ORAL | Status: DC
  Administered 2024-06-05: 1 via ORAL
  Filled 2024-06-04: qty 1

## 2024-06-04 MED ORDER — TERBUTALINE SULFATE 1 MG/ML IJ SOLN: 0.2500 mg | Freq: Once | INTRAMUSCULAR | Status: DC | PRN

## 2024-06-04 MED ORDER — LIDOCAINE HCL (PF) 1 % IJ SOLN: 30.0000 mL | INTRAMUSCULAR | Status: DC | PRN

## 2024-06-04 MED ORDER — LACTATED RINGERS IV SOLN
500.0000 mL | INTRAVENOUS | Status: DC | PRN
  Administered 2024-06-04: 1000 mL via INTRAVENOUS

## 2024-06-04 MED ORDER — OXYCODONE-ACETAMINOPHEN 5-325 MG PO TABS: 1.0000 | ORAL_TABLET | Freq: Once | ORAL | Status: DC | PRN

## 2024-06-04 MED ORDER — LACTATED RINGERS IV SOLN: INTRAVENOUS | Status: DC

## 2024-06-04 MED ORDER — ONDANSETRON HCL 4 MG/2ML IJ SOLN: 4.0000 mg | INTRAMUSCULAR | Status: DC | PRN

## 2024-06-04 MED ORDER — OXYTOCIN-SODIUM CHLORIDE 30-0.9 UT/500ML-% IV SOLN
1.0000 m[IU]/min | INTRAVENOUS | Status: DC
  Administered 2024-06-04: 2 m[IU]/min via INTRAVENOUS
  Filled 2024-06-04 (×2): qty 500

## 2024-06-04 MED ORDER — FLEET ENEMA RE ENEM: 1.0000 | ENEMA | RECTAL | Status: DC | PRN

## 2024-06-04 NOTE — Discharge Summary (Signed)
 Postpartum Discharge Summary  Patient Name: Tracy Morse DOB: 12/11/1995 MRN: [id number]  Date of admission: 06/04/2024 Delivery date:06/04/2024 Delivering provider: MYRON NEST Date of discharge: 06/05/2024  Primary OB: [facility] LMP:No LMP recorded. EDC Estimated Date of Delivery: 06/11/24 Gestational Age at Delivery: [redacted]w[redacted]d   Admitting diagnosis: Encounter for induction of labor [Z34.90] Intrauterine pregnancy: [redacted]w[redacted]d     Secondary diagnosis:   Principal Problem:   NSVD (normal spontaneous vaginal delivery) Active Problems:   Encounter for induction of labor   Discharge Diagnosis: Term Pregnancy Delivered      Hospital course: Induction of Labor With Vaginal Delivery   28 y.o. yo [id number] at [redacted]w[redacted]d was admitted to the hospital 06/04/2024 for induction of labor.  Indication for induction: Elective.  Patient had an labor course complicated by none Membrane Rupture Time/Date: 5:45 PM,06/04/2024  Delivery Method:Vaginal, Spontaneous Operative Delivery:N/A Episiotomy: None Lacerations:  None Details of delivery can be found in separate delivery note.  Patient had an uncomplicated postpartum course. Patient is discharged home 06/05/24.  Newborn Data: Birth date:06/04/2024 Birth time:6:45 PM Gender:Female Living status:Living Apgars:7 ,9  Weight:3400 g                                            Post partum procedures:none Induction:: AROM, Pitocin , and Cytotec  Complications: None Delivery Type: spontaneous vaginal delivery Anesthesia: epidural anesthesia Placenta: spontaneous To Pathology: No   Prenatal Labs:  Blood type/Rh A POS  Antibody screen Neg  Rubella Immune  Varicella Immune  RPR Neg  HBsAg Neg  Hep C Neg  HIV Neg  GC Neg  Chlamydia Neg  Genetic screening Declined  1 hour GTT 84  3 hour GTT N/a  GBS Neg    Magnesium Sulfate received: No BMZ received: No Rhophylac:was not indicated MMR: was not indicated Varivax vaccine given:  was not indicated T-DaP:Given prenatally Flu: not in season  Transfusion:No  Physical exam  Vitals:   06/05/24 0314 06/05/24 0908 06/05/24 1220 06/05/24 1700  BP: 116/72 (!) 124/56 122/65 125/68  Pulse: 72 72 75 80  Resp: 18 20 18 18   Temp: 97.6 F (36.4 C) 97.9 F (36.6 C) 98 F (36.7 C) 98.5 F (36.9 C)  TempSrc: Oral Oral Oral Oral  SpO2: 98% 99% 100% 100%  Weight:      Height:       General: alert, cooperative, and no distress Lochia: appropriate Uterine Fundus: firm Perineum:minimal edema/intact DVT Evaluation: No evidence of DVT seen on physical exam. Negative Homan's sign. No cords or calf tenderness. No significant calf/ankle edema.  Labs: Lab Results  Component Value Date   WBC 12.4 (H) 06/05/2024   HGB 12.3 06/05/2024   HCT 36.2 06/05/2024   MCV 87.0 06/05/2024   PLT 176 06/05/2024       No data to display         Edinburgh Score:    06/04/2024   11:05 PM  Edinburgh Postnatal Depression Scale Screening Tool  I have been able to laugh and see the funny side of things. 0  I have looked forward with enjoyment to things. 0  I have blamed myself unnecessarily when things went wrong. 1  I have been anxious or worried for no good reason. 3  I have felt scared or panicky for no good reason. 3  Things have been getting on top of me. 2  I  have been so unhappy that I have had difficulty sleeping. 0  I have felt sad or miserable. 1  I have been so unhappy that I have been crying. 1  The thought of harming myself has occurred to me. 0  Edinburgh Postnatal Depression Scale Total 11    Risk assessment for postpartum VTE and prophylactic treatment: Very high risk factors: None High risk factors: None Moderate risk factors: None  Postpartum VTE prophylaxis with LMWH not indicated  After visit meds:  Allergies as of 06/05/2024       Reactions   Cephalosporins Rash   Dicloxacillin Sodium Itching        Medication List     STOP taking these  medications    ferrous sulfate  325 (65 FE) MG EC tablet       TAKE these medications    acetaminophen  325 MG tablet Commonly known as: Tylenol  Take 2 tablets (650 mg total) by mouth every 4 (four) hours as needed (for pain scale < 4).   benzocaine -Menthol  20-0.5 % Aero Commonly known as: DERMOPLAST Apply 1 Application topically as needed for irritation (perineal discomfort).   coconut oil Oil Apply 1 Application topically as needed.   dibucaine 1 % Oint Commonly known as: NUPERCAINAL Place 1 Application rectally as needed for hemorrhoids.   ibuprofen  600 MG tablet Commonly known as: ADVIL  Take 1 tablet (600 mg total) by mouth every 6 (six) hours. Start taking on: June 06, 2024   multivitamin-prenatal 27-0.8 MG Tabs tablet Take 1 tablet by mouth daily at 12 noon.   senna-docusate 8.6-50 MG tablet Commonly known as: Senokot-S Take 2 tablets by mouth daily. Start taking on: June 06, 2024   sertraline  50 MG tablet Commonly known as: Zoloft  Take 1 tablet (50 mg total) by mouth daily.   simethicone  80 MG chewable tablet Commonly known as: MYLICON Chew 1 tablet (80 mg total) by mouth as needed for flatulence.   valACYclovir 500 MG tablet Commonly known as: VALTREX Take 500 mg by mouth 2 (two) times daily.   witch hazel-glycerin  pad Commonly known as: TUCKS Apply 1 Application topically as needed for hemorrhoids.       Discharge home in stable condition Infant Feeding: Breast Infant Disposition:home with mother Discharge instruction: per After Visit Summary and Postpartum booklet. Activity: Advance as tolerated. Pelvic rest for 6 weeks.  Diet: routine diet Anticipated Birth Control: none Postpartum Appointment:6 weeks Additional Postpartum F/U: Postpartum Depression checkup Future Appointments:No future appointments.  Follow up Visit:  Follow-up Information     Myron Nest, CNM. Schedule an appointment as soon as possible for a visit in 2 week(s).    Specialty: Certified Nurse Midwife Why: for a mood check Contact information: [address] [place] [address] [phone]         Myron Nest, CNM. Schedule an appointment as soon as possible for a visit in 6 week(s).   Specialty: Certified Nurse Midwife Contact information: [address] [place] [address] [phone]                 Plan:  Tracy Morse was discharged to home in good condition. Follow-up appointment as directed.    Signed:  Bobbette Brunswick CNM

## 2024-06-04 NOTE — Progress Notes (Signed)
 Labor Progress Note  Tracy Morse is a 28 y.o. G3P1011 at [redacted]w[redacted]d by ultrasound admitted for induction of labor due to Elective at term.  Subjective: Pt is comfortable with epidural  Objective: BP 116/87   Pulse 88   Temp 98.4 F (36.9 C) (Oral)   Resp 17   Ht 5' 2 (1.575 m)   Wt 92.5 kg   SpO2 100%   BMI 37.30 kg/m   Fetal Assessment: FHT:  FHR: 120 bpm, variability: moderate,  accelerations:  Present,  decelerations:  Absent Category/reactivity:  Category I UC:   irregular, every 1-5 minutes SVE:    Dilation: 3cm  Effacement: 50%  Station:  -3  Consistency: soft  Position: middle  Membrane status: Intact Amniotic color: n/a  Labs: Lab Results  Component Value Date   WBC 10.2 06/04/2024   HGB 13.2 06/04/2024   HCT 38.4 06/04/2024   MCV 85.0 06/04/2024   PLT 190 06/04/2024    Assessment / Plan: Induction of labor due to elective 0145 1/50/-3 0149 Cytotec  PO and PV 0830 2/50/-3 Started Pitocin  1206 2/50/-3 1600 3/50/-3 Pitocin  currently at 18mU  Labor: Progressing normally Preeclampsia:  102/54 Fetal Wellbeing:  Category I Pain Control:  Labor support without medications I/D:  Afebrile, GBS neg, Intact Anticipated MOD:  NSVD  Tracy Morse, CNM 06/04/2024, 5:59 PM

## 2024-06-04 NOTE — Progress Notes (Signed)
 Labor Progress Note  Tracy Morse is a 28 y.o. G3P1011 at [redacted]w[redacted]d by ultrasound admitted for induction of labor due to Elective at term.  Subjective: Doing well  Objective: BP (!) 124/52 (BP Location: Left Arm)   Pulse 77   Temp 98.2 F (36.8 C) (Oral)   Resp 17   Ht 5' 2 (1.575 m)   Wt 92.5 kg   SpO2 96%   BMI 37.30 kg/m   Fetal Assessment: FHT:  FHR: 120 bpm, variability: moderate,  accelerations:  Present,  decelerations:  Absent Category/reactivity:  Category I UC:   irregular, every 1-4 minutes SVE:    Dilation: 1cm  Effacement: 50%  Station:  -3  Consistency: soft  Position: ---  Membrane status: Intact Amniotic color: n/a  Labs: Lab Results  Component Value Date   WBC 10.2 06/04/2024   HGB 13.2 06/04/2024   HCT 38.4 06/04/2024   MCV 85.0 06/04/2024   PLT 190 06/04/2024    Assessment / Plan: Induction of labor due to elective 0145 1/50/-3 0149 Cytotec  PO and PV 0830 2/50/-3 Starting Pitocin   Labor: Progressing normally Preeclampsia:  124/52 Fetal Wellbeing:  Category I Pain Control:  Labor support without medications I/D:  Afebrile, GBS neg, Intact Anticipated MOD:  NSVD  Tracy Morse, CNM 06/04/2024, 8:20 AM

## 2024-06-04 NOTE — Anesthesia Procedure Notes (Signed)
 Epidural Patient location during procedure: OB Start time: 06/04/2024 4:58 PM End time: 06/04/2024 5:07 PM  Staffing Anesthesiologist: Sigourney Portillo, MD Performed: anesthesiologist   Preanesthetic Checklist Completed: patient identified, IV checked, risks and benefits discussed, surgical consent, monitors and equipment checked, pre-op evaluation and timeout performed  Epidural Patient position: sitting Prep: ChloraPrep Patient monitoring: heart rate, continuous pulse ox and blood pressure Approach: midline Location: L3-L4 Injection technique: LOR air  Needle:  Needle type: Tuohy  Needle gauge: 17 G Needle length: 9 cm Needle insertion depth: 6 cm Catheter at skin depth: 11 cm Test dose: negative and 1.5% lidocaine  with Epi 1:200 K  Assessment Sensory level: T4  Additional Notes Straightforward placement without apparent complications. Reason for block:procedure for pain

## 2024-06-04 NOTE — Progress Notes (Signed)
 Labor Progress Note  Tracy Morse is a 28 y.o. G3P1011 at [redacted]w[redacted]d by ultrasound admitted for induction of labor due to Elective at term.  Subjective: Doing well, coping well with contractions  Objective: BP (!) 102/54   Pulse 74   Temp 98.4 F (36.9 C) (Oral)   Resp 17   Ht 5' 2 (1.575 m)   Wt 92.5 kg   SpO2 96%   BMI 37.30 kg/m   Fetal Assessment: FHT:  FHR: 125 bpm, variability: moderate,  accelerations:  Present,  decelerations:  Absent Category/reactivity:  Category I UC:   irregular, every 1-3 minutes SVE:    Dilation: 2cm  Effacement: 50%  Station:  -3  Consistency: soft  Position: middle  Membrane status: Intact Amniotic color: n/a  Labs: Lab Results  Component Value Date   WBC 10.2 06/04/2024   HGB 13.2 06/04/2024   HCT 38.4 06/04/2024   MCV 85.0 06/04/2024   PLT 190 06/04/2024    Assessment / Plan: Induction of labor due to elective 0145 1/50/-3 0149 Cytotec  PO and PV 0830 2/50/-3 Started Pitocin  1206 2/50/-3 Pitocin  currently at 4mU  Labor: Progressing normally Preeclampsia:  102/54 Fetal Wellbeing:  Category I Pain Control:  Labor support without medications I/D:  Afebrile, GBS neg, Intact Anticipated MOD:  NSVD  Jenifer E Jalayna Josten, CNM 06/04/2024, 12:12 PM

## 2024-06-04 NOTE — Anesthesia Preprocedure Evaluation (Signed)
 Anesthesia Evaluation  Patient identified by MRN, date of birth, ID band Patient awake    Reviewed: Allergy & Precautions, NPO status , Patient's Chart, lab work & pertinent test results  History of Anesthesia Complications Negative for: history of anesthetic complications  Airway Mallampati: II   Neck ROM: Full    Dental   Pulmonary neg pulmonary ROS   Pulmonary exam normal breath sounds clear to auscultation       Cardiovascular Exercise Tolerance: Good negative cardio ROS Normal cardiovascular exam Rhythm:Regular Rate:Normal     Neuro/Psych  Headaches PSYCHIATRIC DISORDERS Anxiety        GI/Hepatic negative GI ROS,,,  Endo/Other  Obesity   Renal/GU negative Renal ROS     Musculoskeletal   Abdominal   Peds  Hematology  (+) Blood dyscrasia, anemia   Anesthesia Other Findings 28 yo G3P1011 at 49 0/7 requesting labor epidural.  Reproductive/Obstetrics                              Anesthesia Physical Anesthesia Plan  ASA: 2  Anesthesia Plan: Epidural   Post-op Pain Management:    Induction:   PONV Risk Score and Plan: 2 and Treatment may vary due to age or medical condition  Airway Management Planned: Natural Airway  Additional Equipment:   Intra-op Plan:   Post-operative Plan:   Informed Consent: I have reviewed the patients History and Physical, chart, labs and discussed the procedure including the risks, benefits and alternatives for the proposed anesthesia with the patient or authorized representative who has indicated his/her understanding and acceptance.     Dental Advisory Given  Plan Discussed with:   Anesthesia Plan Comments: (Patient reports no bleeding problems and no anticoagulant use.   Patient consented for risks of anesthesia including but not limited to:  - adverse reactions to medications - risk of bleeding, infection and or nerve damage from  epidural that could lead to paralysis - risk of headache or failed epidural - nerve damage due to positioning - that if epidural is used for C-section that there is a chance of epidural failure requiring spinal placement or conversion to GA - damage to heart, brain, lungs, other parts of body or loss of life  Patient voiced understanding and assent.)         Anesthesia Quick Evaluation

## 2024-06-04 NOTE — H&P (Signed)
 OB History & Physical   History of Present Illness:  Chief Complaint:   HPI:  Tracy Morse is a 28 y.o. G14P1011 female at [redacted]w[redacted]d dated by 8 week u/s.  She presents to L&D for elective IOL.  She reports:  -active fetal movement -no leakage of fluid -no vaginal bleeding -no contractions  Pregnancy Issues: Obesity Anemia- resolved Unstable lie- plan to scan for presentation on admission Anxiety- wants to start Zoloft  in hospital postpartum Migraines Oral herpes Hx infertility   Maternal Medical History:   Past Medical History:  Diagnosis Date   Anemia    Anorexia    Anxiety    Migraine     Past Surgical History:  Procedure Laterality Date   DILATION AND EVACUATION N/A 07/13/2023   Procedure: SUCTION D&C;  Surgeon: Lovetta Beverli GAILS, MD;  Location: [facility] ORS;  Service: Gynecology;  Laterality: N/A;    Allergies  Allergen Reactions   Cephalosporins Rash   Dicloxacillin Sodium Itching    Prior to Admission medications   Medication Sig Start Date End Date Taking? Authorizing Provider  Prenatal Vit-Fe Fumarate-FA (MULTIVITAMIN-PRENATAL) 27-0.8 MG TABS tablet Take 1 tablet by mouth daily at 12 noon.   Yes [provider]  valACYclovir (VALTREX) 500 MG tablet Take 500 mg by mouth 2 (two) times daily.   Yes [provider]  ferrous sulfate  325 (65 FE) MG EC tablet Take 325 mg by mouth 3 (three) times daily with meals.    [provider]     Prenatal care site: [facility]   Social History: She  reports that she has never smoked. She has never used smokeless tobacco. She reports that she does not drink alcohol and does not use drugs.  Family History: family history includes Alcohol abuse in her father; Anxiety disorder in her mother; Colon cancer in her maternal grandmother and paternal grandmother; Colon polyps in her father and mother; Coronary artery disease in her maternal grandfather and paternal grandfather; High  blood pressure in her father; Hyperlipidemia in her father and mother.   Review of Systems: A full review of systems was performed and negative except as noted in the HPI.    Physical Exam:  Vital Signs: BP 124/61 (BP Location: Left Arm)   Pulse (!) 106   Temp 98.3 F (36.8 C) (Oral)   Resp 18   Ht 5' 2 (1.575 m)   Wt 92.5 kg   SpO2 96%   BMI 37.30 kg/m   General:   alert and cooperative  Skin:  normal  Neurologic:    Alert & oriented x 3  Lungs:   Nl effort  Heart:   regular rate and rhythm  Abdomen:  soft, non-tender; bowel sounds normal; no masses,  no organomegaly  Extremities: : non-tender, symmetric, no edema bilaterally.     EFW: 05/31/24     3,511 g       67%   Results for orders placed or performed during the hospital encounter of 06/04/24 (from the past 24 hours)  CBC     Status: Abnormal   Collection Time: 06/04/24 12:31 AM  Result Value Ref Range   WBC 10.2 4.0 - 10.5 K/uL   RBC 4.52 3.87 - 5.11 MIL/uL   Hemoglobin 13.2 12.0 - 15.0 g/dL   HCT 61.5 63.9 - 53.9 %   MCV 85.0 80.0 - 100.0 fL   MCH 29.2 26.0 - 34.0 pg   MCHC 34.4 30.0 - 36.0 g/dL   RDW 84.3 (  H) 11.5 - 15.5 %   Platelets 190 150 - 400 K/uL   nRBC 0.0 0.0 - 0.2 %  Type and screen     Status: None   Collection Time: 06/04/24 12:31 AM  Result Value Ref Range   ABO/RH(D) A POS    Antibody Screen NEG    Sample Expiration      06/07/2024,2359 Performed at [facility], [address], [place], [place] [address]     Pertinent Results:  Prenatal Labs: Blood type/Rh A POS  Antibody screen Neg  Rubella Immune  Varicella Immune  RPR Neg  HBsAg Neg  Hep C Neg  HIV Neg  GC Neg  Chlamydia Neg  Genetic screening Declined  1 hour GTT 84  3 hour GTT N/a  GBS Neg   FHT: FHR: 120 bpm, variability: moderate,  accelerations:  Present,  decelerations:  Absent Category/reactivity:  Category I TOCO: regular, every 1-5 minutes SVE: Dilation: 1 / Effacement (%): 50 / Station: -3      Assessment:  Tracy Morse is a 28 y.o. G3P1011 female at [redacted]w[redacted]d elective IOL  Plan:  1. Admit to Labor & Delivery; consents reviewed and obtained  2. Fetal Well being  - Fetal Tracing: Cat I - GBS neg - Presentation: vtx confirmed by u/s   3. Routine OB: - Prenatal labs reviewed, as above - Rh pos - CBC & T&S on admit - Clear fluids, IVF  4. Induction of Labor -  Contractions by external toco in place -  Pelvis proven to 3760g -  Plan for induction with Cytotec  -  Plan for continuous fetal monitoring  -  Maternal pain control as desired: IVPM, nitrous, regional anesthesia - Anticipate vaginal delivery  5. Post Partum Planning: - Infant feeding: Pumping - Contraception: None - Tdap: given 04/25/24 - Flu: not in season - RSV: not in season   [place], CNM 06/04/2024 4:28 AM

## 2024-06-04 NOTE — Progress Notes (Signed)
 Labor Progress Note  Tracy Morse is a 28 y.o. G3P1011 at [redacted]w[redacted]d by ultrasound admitted for induction of labor due to Elective at term.  Subjective: Pt is tearful and ready for an epidural  Objective: BP (!) 102/54   Pulse 74   Temp 98.4 F (36.9 C) (Oral)   Resp 17   Ht 5' 2 (1.575 m)   Wt 92.5 kg   SpO2 96%   BMI 37.30 kg/m   Fetal Assessment: FHT:  FHR: 120 bpm, variability: moderate,  accelerations:  Present,  decelerations:  Absent Category/reactivity:  Category I UC:   irregular, every 1-5 minutes SVE:    Dilation: 3cm  Effacement: 50%  Station:  -3  Consistency: soft  Position: middle  Membrane status: Intact Amniotic color: n/a  Labs: Lab Results  Component Value Date   WBC 10.2 06/04/2024   HGB 13.2 06/04/2024   HCT 38.4 06/04/2024   MCV 85.0 06/04/2024   PLT 190 06/04/2024    Assessment / Plan: Induction of labor due to elective 0145 1/50/-3 0149 Cytotec  PO and PV 0830 2/50/-3 Started Pitocin  1206 2/50/-3 1600 3/50/-3 Pitocin  currently at 18mU  Labor: Progressing normally Preeclampsia:  102/54 Fetal Wellbeing:  Category I Pain Control:  Labor support without medications I/D:  Afebrile, GBS neg, Intact Anticipated MOD:  NSVD  Margery FORBES Coe, CNM 06/04/2024, 4:03 PM

## 2024-06-05 ENCOUNTER — Encounter: Payer: Self-pay | Admitting: Obstetrics and Gynecology

## 2024-06-05 LAB — CBC
HCT: 36.2 % (ref 36.0–46.0)
Hemoglobin: 12.3 g/dL (ref 12.0–15.0)
MCH: 29.6 pg (ref 26.0–34.0)
MCHC: 34 g/dL (ref 30.0–36.0)
MCV: 87 fL (ref 80.0–100.0)
Platelets: 176 K/uL (ref 150–400)
RBC: 4.16 MIL/uL (ref 3.87–5.11)
RDW: 15.7 % — ABNORMAL HIGH (ref 11.5–15.5)
WBC: 12.4 K/uL — ABNORMAL HIGH (ref 4.0–10.5)
nRBC: 0 % (ref 0.0–0.2)

## 2024-06-05 MED ORDER — SERTRALINE HCL 50 MG PO TABS: 50.0000 mg | ORAL_TABLET | Freq: Every day | ORAL | 3 refills | Status: AC

## 2024-06-05 MED ORDER — SIMETHICONE 80 MG PO CHEW: 80.0000 mg | CHEWABLE_TABLET | ORAL | Status: AC | PRN

## 2024-06-05 MED ORDER — ACETAMINOPHEN 325 MG PO TABS: 650.0000 mg | ORAL_TABLET | ORAL | Status: AC | PRN

## 2024-06-05 MED ORDER — DIBUCAINE (PERIANAL) 1 % EX OINT: 1.0000 | TOPICAL_OINTMENT | CUTANEOUS | Status: AC | PRN

## 2024-06-05 MED ORDER — COCONUT OIL OIL: 1.0000 | TOPICAL_OIL | Status: AC | PRN

## 2024-06-05 MED ORDER — WITCH HAZEL-GLYCERIN EX PADS: 1.0000 | MEDICATED_PAD | CUTANEOUS | Status: AC | PRN

## 2024-06-05 MED ORDER — IBUPROFEN 600 MG PO TABS: 600.0000 mg | ORAL_TABLET | Freq: Four times a day (QID) | ORAL | 0 refills | Status: AC

## 2024-06-05 MED ORDER — SENNOSIDES-DOCUSATE SODIUM 8.6-50 MG PO TABS: 2.0000 | ORAL_TABLET | Freq: Every day | ORAL | Status: AC

## 2024-06-05 MED ORDER — BENZOCAINE-MENTHOL 20-0.5 % EX AERO: 1.0000 | INHALATION_SPRAY | CUTANEOUS | Status: AC | PRN

## 2024-06-05 NOTE — Lactation Note (Signed)
 This note was copied from a baby's chart. Lactation Consultation Note  Patient Name: Tracy Morse Date: 06/05/2024 Age:29 hours Reason for consult: Initial assessment;Term;Other (Comment) (Discharge Education)   Maternal Data Does the patient have breastfeeding experience prior to this delivery?: Yes How long did the patient breastfeed?: Exclusively pumped - 6 months  Initial assessment w/ a 21hr old baby Tracy and [name].  This was a SVD.  Patient w/ a hx of obesity, anxiety, and infertility. Patient verbalized that she exclusively pumped with her first child but also got mastitis 2x.    Feedings at the breast are not going so good per mom.  She stated that latching hurts really bad. Patient verbalized that she has stopped putting infant to the breast and uses a hand pump to express milk out.  Any milk she expresses is fed to infant followed by 20ml of formula.  Feeding Mother's Current Feeding Choice: Breast Milk and Formula  Encouraged mom to call out for lactation at the next feeding.  Interventions Interventions: Breast feeding basics reviewed;Education;CDC milk storage guidelines  LC provided education on the following;  milk production expectations, hunger cues, day 1/2 wet/dirty diapers, hand expression, cluster feeding, benefits of STS and arousing infant for a feeding.  Lactation informed patient of feeding infant at least 8 or more times w/in a 24hr period but not exceeding 3hrs. Patient verbalized understanding.   Education was provided on how to care for nipples.  LC provided simple pointers on how to help infant relax jaw if it is tight.   Discharge Discharge Education: Engorgement and breast care;Outpatient recommendation Pump: Personal;Hands Naelani Lafrance;Manual;DEBP (Spectra ; Mom Cozy; Medela Hand Pump) WIC Program: No  Education on engorgement prevention/treatment was discussed as well as breastmilk storage guidelines.  LC provided patient with a handout on  breastmilk storage guidelines from [facility]. [facility] outpatient lactation services phone number written on the white board in the room.  Patient verbalized understanding.  Consult Status Consult Status: PRN    Rosena Bartle S Kerby Borner 06/05/2024, 4:43 PM

## 2024-06-05 NOTE — Progress Notes (Signed)
 Patient d/c home with infant. D/c instructions, Rx, and f/u appt given to and reviewed with pt. Pt verbalized understanding. Escorted out by staff.

## 2024-06-05 NOTE — Lactation Note (Signed)
 This note was copied from a baby's chart. Lactation Consultation Note  Patient Name: Tracy Morse Date: 06/05/2024 Age:29 hours Reason for consult: Initial assessment;Term;Other (Comment) (Discharge Education)  [facility] support offered for evening shift but mom stated that she is discharging soon. [facility] outpatient contact information given and mom states that she is pumping to give her nipples a break due to soreness.   Maternal Data Does the patient have breastfeeding experience prior to this delivery?: Yes How long did the patient breastfeed?: Exclusively pumped - 6 months  Feeding Mother's Current Feeding Choice: Breast Milk and Formula  LATCH Score                    Lactation Tools Discussed/Used    Interventions Interventions: Breast feeding basics reviewed;Education;CDC milk storage guidelines  Discharge Discharge Education: Engorgement and breast care;Outpatient recommendation Pump: Personal;Hands Free;Manual;DEBP (Spectra ; Mom Cozy; Medela Hand Pump) WIC Program: No  Consult Status Consult Status: PRN    Katheryn DELENA Canton 06/05/2024, 7:03 PM

## 2024-06-05 NOTE — Anesthesia Postprocedure Evaluation (Signed)
 Anesthesia Post Note  Patient: Tracy Morse  Procedure(s) Performed: AN AD HOC LABOR EPIDURAL  Patient location during evaluation: Mother Baby Anesthesia Type: Epidural Level of consciousness: awake and alert Pain management: pain level controlled Vital Signs Assessment: post-procedure vital signs reviewed and stable Respiratory status: spontaneous breathing, nonlabored ventilation and respiratory function stable Cardiovascular status: stable Postop Assessment: no headache, no backache and epidural receding Anesthetic complications: no   No notable events documented.   Last Vitals:  Vitals:   06/05/24 0314 06/05/24 0908  BP: 116/72 (!) 124/56  Pulse: 72 72  Resp: 18 20  Temp: 36.4 C 36.6 C  SpO2: 98% 99%    Last Pain:  Vitals:   06/05/24 0908  TempSrc: Oral  PainSc:                  Madalyn Hammock

## 2024-06-06 NOTE — TOC CM/SW Note (Signed)
..  Transition of Care [facility]) - Inpatient Brief Assessment   Patient Details  Name: Tracy Morse MRN: [id number] Date of Birth: [birth year]  [facility] of Care [facility]) CM/SW Contact:    Edsel DELENA Fischer, LCSW Phone Number: 06/06/2024, 3:33 PM   Clinical Narrative:  SW met with pt at bedside. Pt was holding baby when sw arrived.  Pt stated that she has a 29 year old named Tracy Morse that lives in the home along with her husband (FOB) Cyndee Minors. Will be a household of 4.   Pt stated that her anxiety has left now that the baby has arrived.  Pt stated that she had fertility issues and lost a baby before having this one.  Pt stated that she had to under go fertility treatments to get pregnant.    Pt denied SI/HI/ DV. Pt stated that her supports are her family.  Pt stated that she has her bachelors degree in nursing.  Pt stated that she will not be needing WIC or FS.  Mental Health history of anxiety.  Pt was provided a list of mental health resources in the area. Husband will be providing transportation once discharged.  Pt stated that she has what baby needs to include but not limited to car seat and bassinet.  Pediatrician will be at [facility].  SW reviewed, discussed and provided pt  handouts (New Mom Checklist for Maternal Mental Health Help, What does a safe sleep environment look like?, Safe sleep for your baby, Car safety seat check up, Perinatal Mood and Anxiety Disorders (PMADS), Free Mental Health Apps, Weed to Know for [employer] and You.  Transition of Care Asessment:
